# Patient Record
Sex: Female | Born: 1960 | Race: White | Hispanic: No | Marital: Married | Smoking: Never smoker
Health system: Southern US, Community
[De-identification: ages and names within clinical notes are randomized; demographics above are authoritative.]

## PROBLEM LIST (undated history)

## (undated) DIAGNOSIS — K219 Gastro-esophageal reflux disease without esophagitis: Secondary | ICD-10-CM

## (undated) DIAGNOSIS — I10 Essential (primary) hypertension: Secondary | ICD-10-CM

## (undated) DIAGNOSIS — R112 Nausea with vomiting, unspecified: Secondary | ICD-10-CM

## (undated) DIAGNOSIS — B9681 Helicobacter pylori [H. pylori] as the cause of diseases classified elsewhere: Secondary | ICD-10-CM

## (undated) DIAGNOSIS — Q825 Congenital non-neoplastic nevus: Secondary | ICD-10-CM

## (undated) DIAGNOSIS — R9409 Abnormal results of other function studies of central nervous system: Secondary | ICD-10-CM

## (undated) DIAGNOSIS — Z9889 Other specified postprocedural states: Secondary | ICD-10-CM

## (undated) DIAGNOSIS — N6310 Unspecified lump in the right breast, unspecified quadrant: Secondary | ICD-10-CM

## (undated) DIAGNOSIS — R55 Syncope and collapse: Secondary | ICD-10-CM

## (undated) DIAGNOSIS — K2970 Gastritis, unspecified, without bleeding: Secondary | ICD-10-CM

## (undated) HISTORY — DX: Gastritis, unspecified, without bleeding: K29.70

## (undated) HISTORY — PX: OTHER SURGICAL HISTORY: SHX169

## (undated) HISTORY — DX: Syncope and collapse: R55

## (undated) HISTORY — PX: ELBOW ARTHROSCOPY: SHX614

## (undated) HISTORY — PX: TUBAL LIGATION: SHX77

## (undated) HISTORY — DX: Helicobacter pylori (H. pylori) as the cause of diseases classified elsewhere: B96.81

## (undated) HISTORY — PX: CHOLECYSTECTOMY: SHX55

## (undated) HISTORY — PX: ABDOMINAL HYSTERECTOMY: SHX81

## (undated) HISTORY — DX: Essential (primary) hypertension: I10

---

## 2000-10-01 ENCOUNTER — Encounter: Payer: Self-pay | Admitting: Internal Medicine

## 2000-10-01 ENCOUNTER — Ambulatory Visit (HOSPITAL_COMMUNITY): Admission: RE | Admit: 2000-10-01 | Discharge: 2000-10-01 | Payer: Self-pay | Admitting: Internal Medicine

## 2000-10-07 ENCOUNTER — Other Ambulatory Visit: Admission: RE | Admit: 2000-10-07 | Discharge: 2000-10-07 | Payer: Self-pay | Admitting: *Deleted

## 2002-04-04 ENCOUNTER — Ambulatory Visit (HOSPITAL_COMMUNITY): Admission: RE | Admit: 2002-04-04 | Discharge: 2002-04-04 | Payer: Self-pay | Admitting: Family Medicine

## 2002-04-04 ENCOUNTER — Encounter: Payer: Self-pay | Admitting: Family Medicine

## 2002-07-04 ENCOUNTER — Inpatient Hospital Stay (HOSPITAL_COMMUNITY): Admission: RE | Admit: 2002-07-04 | Discharge: 2002-07-06 | Payer: Self-pay | Admitting: Obstetrics & Gynecology

## 2002-09-28 ENCOUNTER — Ambulatory Visit (HOSPITAL_COMMUNITY): Admission: RE | Admit: 2002-09-28 | Discharge: 2002-09-28 | Payer: Self-pay | Admitting: Obstetrics & Gynecology

## 2003-12-13 ENCOUNTER — Ambulatory Visit (HOSPITAL_COMMUNITY): Admission: RE | Admit: 2003-12-13 | Discharge: 2003-12-13 | Payer: Self-pay | Admitting: Family Medicine

## 2004-03-12 ENCOUNTER — Ambulatory Visit (HOSPITAL_COMMUNITY): Admission: RE | Admit: 2004-03-12 | Discharge: 2004-03-12 | Payer: Self-pay | Admitting: Internal Medicine

## 2004-04-27 HISTORY — PX: OTHER SURGICAL HISTORY: SHX169

## 2004-04-27 HISTORY — PX: RECTOCELE REPAIR: SHX761

## 2004-04-27 HISTORY — PX: VAGINAL HYSTERECTOMY: SHX2639

## 2007-01-03 ENCOUNTER — Ambulatory Visit (HOSPITAL_COMMUNITY): Admission: RE | Admit: 2007-01-03 | Discharge: 2007-01-03 | Payer: Self-pay | Admitting: Obstetrics & Gynecology

## 2008-05-16 ENCOUNTER — Ambulatory Visit (HOSPITAL_COMMUNITY): Admission: RE | Admit: 2008-05-16 | Discharge: 2008-05-16 | Payer: Self-pay | Admitting: Family Medicine

## 2009-01-21 ENCOUNTER — Ambulatory Visit: Payer: Self-pay | Admitting: Orthopedic Surgery

## 2009-01-21 DIAGNOSIS — M25529 Pain in unspecified elbow: Secondary | ICD-10-CM | POA: Insufficient documentation

## 2009-01-21 DIAGNOSIS — M7710 Lateral epicondylitis, unspecified elbow: Secondary | ICD-10-CM | POA: Insufficient documentation

## 2009-03-04 ENCOUNTER — Ambulatory Visit: Payer: Self-pay | Admitting: Orthopedic Surgery

## 2009-03-05 ENCOUNTER — Encounter (INDEPENDENT_AMBULATORY_CARE_PROVIDER_SITE_OTHER): Payer: Self-pay | Admitting: *Deleted

## 2009-03-08 ENCOUNTER — Ambulatory Visit (HOSPITAL_COMMUNITY): Admission: RE | Admit: 2009-03-08 | Discharge: 2009-03-08 | Payer: Self-pay | Admitting: Orthopedic Surgery

## 2009-03-14 ENCOUNTER — Telehealth: Payer: Self-pay | Admitting: Orthopedic Surgery

## 2009-03-14 ENCOUNTER — Ambulatory Visit: Payer: Self-pay | Admitting: Orthopedic Surgery

## 2009-03-15 ENCOUNTER — Ambulatory Visit (HOSPITAL_COMMUNITY): Admission: RE | Admit: 2009-03-15 | Discharge: 2009-03-15 | Payer: Self-pay | Admitting: Orthopedic Surgery

## 2009-03-15 ENCOUNTER — Ambulatory Visit: Payer: Self-pay | Admitting: Orthopedic Surgery

## 2009-03-19 ENCOUNTER — Ambulatory Visit: Payer: Self-pay | Admitting: Orthopedic Surgery

## 2009-03-25 ENCOUNTER — Encounter: Payer: Self-pay | Admitting: Orthopedic Surgery

## 2009-04-01 ENCOUNTER — Ambulatory Visit: Payer: Self-pay | Admitting: Orthopedic Surgery

## 2009-04-22 ENCOUNTER — Encounter: Payer: Self-pay | Admitting: Orthopedic Surgery

## 2009-04-25 ENCOUNTER — Ambulatory Visit: Payer: Self-pay | Admitting: Orthopedic Surgery

## 2009-05-03 ENCOUNTER — Encounter: Payer: Self-pay | Admitting: Orthopedic Surgery

## 2009-06-06 ENCOUNTER — Ambulatory Visit: Payer: Self-pay | Admitting: Orthopedic Surgery

## 2010-05-27 NOTE — Letter (Signed)
Summary: [employer] disability forms  [employer] disability forms   Imported By: Jacklynn Ganong 05/13/2009 11:48:23  _____________________________________________________________________  External Attachment:    Type:   Image     Comment:   External Document

## 2010-05-27 NOTE — Letter (Signed)
Summary: [employer] disability forms  [employer] disability forms   Imported By: Jacklynn Ganong 05/13/2009 11:47:21  _____________________________________________________________________  External Attachment:    Type:   Image     Comment:   External Document

## 2010-05-27 NOTE — Assessment & Plan Note (Signed)
Summary: 6 WK RECK RT ELBOW /POST OP 03/15/09/BCBS/BSF   Visit Type:  Follow-up Referring Provider:  Dr Cherre Huger  CC:  post op elbow.  History of Present Illness: I saw Jodi Jimenez in the office today for a followup visit.  She is a 50 years old woman with the complaint of:  DOS 03/15/09 right elbow.  Procedure  repair extensor tendon EDC right elbow with suture anchors.  Medication Ibuprofen 800.  Subjectives 6 week recheck elbow after doing exercises for ROM.  Doing well, every now and then she has a few sharp pains in cold weather.  normal exam   f/u as needed   RTW MONDAY        Allergies: 1)  ! Codeine   Impression & Recommendations:  Problem # 1:  LATERAL EPICONDYLITIS (ICD-726.32) Assessment Improved  Orders: Post-Op Check (34742)  Problem # 2:  AFTERCARE FOLLOW SURGERY MUSCULOSKEL SYSTEM NEC (ICD-V58.78) Assessment: Improved  Orders: Post-Op Check (59563)  Patient Instructions: 1)  Released  2)  RTW MONDAY

## 2010-05-27 NOTE — Letter (Signed)
Summary: Out of Work  [facility]  [address]  [place], [place] [address]   Phone: [phone]  Fax: [phone]    May 03, 2009   Employee:  Jodi Jimenez    To Whom It May Concern:   For Medical reasons, please excuse the above named employee from work for the following dates:  Start:   March 14, 2009  End:   June 06, 2009     (Next scheduled appt date: June 06, 2009)  Approximate Return to work:  June 07, 2009    If you need additional information, please feel free to contact our office.         Sincerely,    Terrance Mass, MD

## 2010-05-27 NOTE — Letter (Signed)
Summary: Out of Work  [facility]  [address]  [place], [place] [address]   Phone: [phone]  Fax: [phone]    June 06, 2009   Employee:  Jodi Jimenez    To Whom It May Concern:   For Medical reasons, and per appointment in our office today, please continue out of work status for  the above named employee from work as follows:  Start:   June 06, 2009  Patient is medically cleared to return to work, full duty, no restrictions:  June 10, 2009    If you need additional information, please feel free to contact our office.         Sincerely,    Terrance Mass, MD

## 2010-06-16 ENCOUNTER — Other Ambulatory Visit (HOSPITAL_COMMUNITY): Payer: Self-pay | Admitting: Family Medicine

## 2010-06-19 ENCOUNTER — Ambulatory Visit (HOSPITAL_COMMUNITY)
Admission: RE | Admit: 2010-06-19 | Discharge: 2010-06-19 | Disposition: A | Discharge status: Home / Self Care | Payer: BC Managed Care – PPO | Source: Ambulatory Visit | Attending: Family Medicine | Admitting: Family Medicine

## 2010-06-19 DIAGNOSIS — R109 Unspecified abdominal pain: Secondary | ICD-10-CM | POA: Insufficient documentation

## 2010-06-19 DIAGNOSIS — Z9889 Other specified postprocedural states: Secondary | ICD-10-CM | POA: Insufficient documentation

## 2010-07-30 LAB — CBC
HCT: 39.2 % (ref 36.0–46.0)
Hemoglobin: 13.4 g/dL (ref 12.0–15.0)
MCHC: 34.2 g/dL (ref 30.0–36.0)
RBC: 4.04 MIL/uL (ref 3.87–5.11)
RDW: 13 % (ref 11.5–15.5)

## 2010-07-30 LAB — BASIC METABOLIC PANEL
CO2: 28 mEq/L (ref 19–32)
Calcium: 9.2 mg/dL (ref 8.4–10.5)
GFR calc Af Amer: >60 mL/min (ref >60)
GFR calc non Af Amer: >60 mL/min (ref >60)
Glucose, Bld: 84 mg/dL (ref 70–99)
Potassium: 3.5 mEq/L (ref 3.5–5.1)
Sodium: 137 mEq/L (ref 135–145)

## 2010-08-19 ENCOUNTER — Ambulatory Visit (INDEPENDENT_AMBULATORY_CARE_PROVIDER_SITE_OTHER): Payer: BC Managed Care – PPO | Admitting: Internal Medicine

## 2010-09-09 NOTE — Op Note (Signed)
[name], Jodi Jimenez               ACCOUNT NO.:  [id number]   MEDICAL RECORD NO.:  [id number]          PATIENT TYPE:  AMB   LOCATION:  SDC                           FACILITY:  [facility]   PHYSICIAN:  Genia Del, M.D.DATE OF BIRTH:  [birth year]   DATE OF PROCEDURE:  01/03/2007  DATE OF DISCHARGE:                               OPERATIVE REPORT   PREOPERATIVE DIAGNOSIS:  Rectocele, grade 3/3.   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   PROCEDURE:  Repair of rectocele and enterocele.   SURGEON:  Genia Del, M.D.   ASSISTANT:  Lendon Colonel, M.D.   ANESTHESIOLOGIST:  Octaviano Glow. Pamalee Leyden, M.D.   PROCEDURE:  Under general anesthesia with laryngeal mask, the patient is  in lithotomy position.  She is prepped with Betadine on the suprapubic,  vulvar, and vaginal areas.  The bladder catheter is inserted, and the  patient is draped as usual.   We did a vaginal exam revealing status post hysterectomy.  The anterior  vaginal wall is normal.  The posterior vaginal wall presents a  rectocele, grade 3/3.  We put Allis' at the perineum.  We open with the  scalpel at the fourchette and resect the skin in a diamond shaped  fashion.  We then infiltrate the skin with lidocaine 1% with  epinephrine.  We used 20 mL at the posterior vaginal wall.  We then  separate the vaginal mucosa from the rectocele in the midline  posteriorly.  We then section the vaginal mucosa as we progress up to  about 3 cm from the vaginal cuff.  We then use the scalpel and the  Stroly scissors to dissect at the border of the mucosa on each side and  then separate the rectocele from the vaginal mucosa on each side.  We  notice an enterocele.  The bag is open and a pursestring suture of  Vicryl 2-0 is used to close the enterocele bag.  We then proceed to  reapproximate the fascia on each side to reduce the rectocele.  We use  Vicryl 2-0 in separate stitch to accomplish that.  We then resect  the  excess of vaginal mucosa on each side and close the vaginal mucosa with  a running locked suture of Vicryl 2-0.  We then arrive at the perineum.  We use a Vicryl 2-0 to close the perineal body and close the perineum at  the skin with a Vicryl 3-0.  Hemostasis is adequate at all  levels.  The  estimated blood loss was minimal.  No complications occurred, and the  patient was transferred to recovery room in good stable status.  Note  that a dose of Ancef 1 gram IV was given.      Genia Del, M.D.  Electronically Signed     ML/MEDQ  D:  01/03/2007  T:  01/03/2007  Job:  [id number]

## 2010-09-12 NOTE — H&P (Signed)
   NAME:  Jodi Jimenez, Jodi Jimenez                         ACCOUNT NO.:  [id number]   MEDICAL RECORD NO.:  [id number]                   PATIENT TYPE:  INP   LOCATION:  A426                                 FACILITY:  [facility]   PHYSICIAN:  Lazaro Arms, M.D.                DATE OF BIRTH:  Mar 07, 1961   DATE OF ADMISSION:  07/04/2002  DATE OF DISCHARGE:                                HISTORY & PHYSICAL   NOTE:  This is to take the place of the handwritten H&P that I did on the  day of surgery.   HISTORY OF PRESENT ILLNESS:  The patient is a 50 year old white female  gravida 3, para 3, who presented to me last month complaining of symptomatic  pelvic prolapse and genuine urinary stress incontinence.  She also has heavy  periods with clotting, dysmenorrhea and hypermenorrhea.  On examination she  has a grade 3 bladder and uterine prolapse and she has pure genuine stress  urinary incontinence, she has positive hypermobility of her bladder neck.   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   PAST SURGICAL HISTORY:  Laparoscopic cholecystectomy and laparoscopic  bilateral tubal ligation.   MEDICATIONS:  Florinef.   PAST OBSTETRICAL HISTORY:  She is G3, P3.   ALLERGIES:  None.   FAMILY HISTORY:  Noncontributory.   PHYSICAL EXAMINATION:  VITAL SIGNS:  She is 140/90.  HEENT:  Unremarkable.  NECK:  Thyroid normal.  LUNGS:  Clear.  HEART:  Regular rate/rhythm without murmurs, rubs, or gallops.  BREASTS:  Without mass, discharge, skin changes.  ABDOMEN:  Soft, nontender, nondistended; no hepatosplenomegaly; no masses.  EXTREMITIES:  No edema.  GU:  She has normal external genitalia.  She has a grade 3 cystocele and  grade 3 uterine prolapse with Valsalva.  There is mild rectocele with no  enteroceles appreciated.  The uterus normal size, shape,  contour.  The  ovaries are nontender bilaterally.  NEUROLOGICAL:  Grossly intact.   IMPRESSION:  1. Symptomatic uterovaginal prolapse, grade 3.  2. Genuine stress urinary incontinence.  3. Dysmenorrhea.  4. Hypermenorrhea.   PLAN:  The patient admitted for a TVH, anterior colporrhaphy with Pelvicol  graft, possible posterior colporrhaphy and a Urotex retropubic sling  interventional vault suspension.  The patient understands the risks,  benefits, indications, alternatives and we will proceed.                                               Lazaro Arms, M.D.    Loraine Maple  D:  07/06/2002  T:  07/06/2002  Job:  [id number]

## 2010-09-12 NOTE — Op Note (Signed)
NAME:  Jodi Jimenez, Jodi Jimenez                         ACCOUNT NO.:  [id number]   MEDICAL RECORD NO.:  [id number]                   PATIENT TYPE:  AMB   LOCATION:  DAY                                  FACILITY:  [facility]   PHYSICIAN:  Lazaro Arms, M.D.                DATE OF BIRTH:  [birth year]   DATE OF PROCEDURE:  09/28/2002  DATE OF DISCHARGE:                                 OPERATIVE REPORT   PREOPERATIVE DIAGNOSIS:  Postoperative urinary retention status post a  Pelvicol graft and sling approximately three months ago.   POSTOPERATIVE DIAGNOSIS:  Postoperative urinary retention status post a  Pelvicol graft and sling approximately three months ago.   PROCEDURE:  Urethrolysis.   SURGEON:  Lazaro Arms, M.D.   ANESTHESIA:  General endotracheal.   FINDINGS:  The patient had a TVH and left salpingectomy, an anterior  colporrhaphy with a Pelvicol graft, and Uretex mid-urethral sling  approximately three months ago.  She had problems with postoperative urinary  retention when her bladder got full.  She was doing self-catheterization  without difficulty, but her voided volumes were never greater than 100 cc,  and she could not pee in the morning or late at night.  As a result, we  decided to bring her, after trying Cardura which she could not take because  she has a mineral corticoid deficiency and Urecholine for a urethrolysis of  her sling.   DESCRIPTION OF PROCEDURE:  The patient was taken to the operating room and  placed in the supine position where she underwent general endotracheal  anesthesia.  She was then placed in the high lithotomy position.  The lower  abdomen, perineum, vagina, and perianal area were prepped and draped in the  usual sterile fashion.  A Foley catheter was placed.  The bladder neck was  identified, and the end of the urethra was identified.  It was injected and  incised and dissected open.  I found the graft without difficulty.  It was  in the proper  position.  It had such good tissue ingrowth that I really  could not loosen it up.  As a result, I cut it at 8 and 4 and sent that part  of the sling off to pathology for confirmation.  Her Pelvicol graft was in  place and came up to the level of the bladder neck, and I think that is  probably going to be sufficient for her.  Interrupted sutures were then  placed for closure of the surgical defect.  There was good hemostasis.  No  packing was used.  The Foley was removed.  The patient was awakened from  anesthesia and taken to the recovery room in good and stable condition.  All  counts were correct.  Lazaro Arms, M.D.    Loraine Maple  D:  09/28/2002  T:  09/28/2002  Job:  [id number]

## 2010-09-12 NOTE — H&P (Signed)
NAME:  Jodi Jimenez, Jodi Jimenez                         ACCOUNT NO.:  [id number]   MEDICAL RECORD NO.:  [id number]                   PATIENT TYPE:  AMB   LOCATION:  DAY                                  FACILITY:  [facility]   PHYSICIAN:  Lazaro Arms, M.D.                DATE OF BIRTH:  02/25/1961   DATE OF ADMISSION:  09/28/2002  DATE OF DISCHARGE:                                HISTORY & PHYSICAL   HISTORY OF PRESENT ILLNESS:  The patient is a 50 year old white female,  gravida 3, para 3, status post TVH, left salpingectomy, anterior  colporrhaphy, with a __________ and a placement of a Urotex mid urethral  sling.  The patient had a significant bladder prolapse and concern was that  postoperatively that an anterior colporrhaphy with the graft, that we would  leave her urethra hypermobile and she would experience stress incontinence.  She had had some episodes of that in the past but not very frequently and we  talked about the pluses and minuses of doing that.  Postoperatively she had  significant problem with urinary retention.  She does void.  She cannot void  when her bladder is full and she is unable to void to effectively empty her  bladder.  I put her on Cardura but she is unable to tolerate it because of  her use of Florinef because she has a __________ disorder and requires  actually the steroids to maintain her blood pressure.  We tried her on that  and of course she could not tolerate it.  She took one dose.  We have given  now close to three months to heal, also gave her Urecholine to see if  improving her bladder tone would help and that has not helped either.  As a  result she understands that probably the graft healed in too tightly and  with the bladder support, is creating an obstructive picture.  As a result  she is admitted for a sling revision, probably which will entail cutting the  Urotex sling.   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   PAST SURGICAL HISTORY:  1. Laparoscopic tubal.  2. Laparoscopic cholecystectomy.  3. Above mentioned procedure.   PAST OB HISTORY:  Three vaginal deliveries.   ALLERGIES:  CODEINE.   MEDICATIONS:  Florinef.   REVIEW OF SYSTEMS:  Otherwise negative currently.   PHYSICAL EXAMINATION:  VITAL SIGNS:  Her weight is 167 pounds, blood  pressure 120/80.  HEENT:  Unremarkable.  NECK:  thyroid is normal.  LUNGS:  Clear.  HEART:  Regular rate and rhythm.  No regurg or gallop.  BREASTS:  Without masses or discharge, skin changes.  ABDOMEN:  Benign.  No hepatosplenomegaly or masses.  GENITALIA:  She has normal external genitalia.  Vagina is pink and moist  without discharge.  Cuff is well-healed.  Grafts intact.  RECTAL:  Exam  is normal.  EXTREMITIES:  Warm without edema.  NEUROLOGIC:  Grossly intact.   IMPRESSION:  1. Postoperative urinary retention after a Urotex  mid urethral sling     placement.  2. Unable to take Cardura because of adrenal corticoid hypofunction.   PLAN:  The patient is admitted for a urethrolysis, which is cutting the  Urotex sling.  She understands the indications for the surgery, the fact  that we tried other alternatives that did not work, and she understands the  risk of stress incontinence postoperatively.                                               Lazaro Arms, M.D.    Loraine Maple  D:  09/27/2002  T:  09/27/2002  Job:  [id number]

## 2010-09-12 NOTE — Discharge Summary (Signed)
   NAME:  Jodi Jimenez, Jodi Jimenez                         ACCOUNT NO.:  [id number]   MEDICAL RECORD NO.:  [id number]                   PATIENT TYPE:  INP   LOCATION:  A426                                 FACILITY:  [facility]   PHYSICIAN:  Lazaro Arms, M.D.                DATE OF BIRTH:  13-Feb-1961   DATE OF ADMISSION:  07/04/2002  DATE OF DISCHARGE:  07/06/2002                                 DISCHARGE SUMMARY   DISCHARGE DIAGNOSES:  1. Status post total vaginal hysterectomy.  2. Left salpingectomy.  3. Anterior colporrhaphy with pelvic hull graft.  4. A Urotext retropubic sling placement and a vaginal vault suspension.     Normal postoperative course.   HISTORY OF PRESENT ILLNESS:  Please refer to the transcribed history and  physical and operative note details mentioned in the hospital.   HOSPITAL COURSE:  The patient did well postoperatively.  Packing was removed  on postoperative day #1.  Her hemoglobin and hematocrit were stable.  She  came in slightly anemic and had an appropriate expected drop from 12.2 to  10.2.  She remained afebrile, tolerated clear liquids and a regular diet.  Her Foley catheter was removed, but she could not void so it was replaced  and she was sent home on postoperative day #2 to come back to the office the  following week and have it removed, undergo voiding trials, and probably  self-catheterization.   DISCHARGE MEDICATIONS:  She is discharged on Tylox and Levaquin.   DISCHARGE INSTRUCTIONS:  She is given instruction/precautions for return.                                               Lazaro Arms, M.D.    Loraine Maple  D:  07/12/2002  T:  07/12/2002  Job:  [id number]

## 2010-09-12 NOTE — Op Note (Signed)
NAME:  Jodi Jimenez, BLACKSTON                         ACCOUNT NO.:  [id number]   MEDICAL RECORD NO.:  [id number]                   PATIENT TYPE:  INP   LOCATION:  A426                                 FACILITY:  [facility]   PHYSICIAN:  Lazaro Arms, M.D.                DATE OF BIRTH:  [birth year]   DATE OF PROCEDURE:  07/04/2002  DATE OF DISCHARGE:                                 OPERATIVE REPORT   PREOPERATIVE DIAGNOSES:  1. Symptomatic uterovaginal prolapse, grade 3.  2. Stress urinary incontinence.  3. Dysmenorrhea.  4. Hypermenorrhea.   POSTOPERATIVE DIAGNOSES:  1. Symptomatic uterovaginal prolapse, grade 3.  2. Stress urinary incontinence.  3. Dysmenorrhea.  4. Hypermenorrhea.  5. Left hydrosalpinx, status post tubal.   PROCEDURE:  1. Vaginal hysterectomy.  2. Uretex retropubic sling.  3. Vaginal vault suspension, interspinous.  4. Anterior colporrhaphy using a Pelvicol graft.  5. Left salpingectomy due to the hydrosalpinx on the left.   SURGEON:  Lazaro Arms, M.D.   ANESTHESIA:  General endotracheal.   FINDINGS:  The patient was known to have grade 3 bladder and uterine  prolapse.  Really no rectal problem, no enterocele appreciated.  Preoperatively, her uterus had a small fibroid on the posterior wall.  The  ovaries were both normal bilaterally, but her left fallopian tube was a  hydrosalpinx status post tubal.   DESCRIPTION OF PROCEDURE:  The patient was taken to the operating room and  placed in the supine position where she underwent general endotracheal  anesthesia.  She was then placed in the dorsal supine position in candy-cane  stirrups.  The lower abdomen, perineum, inner thigh, vagina, and perirectal  area were prepped and then she was subsequently draped.  A Foley catheter  was placed.   A weighted speculum was placed.  The cervix was grasped.  Marcaine 0.5% with  1:200,000 epinephrine was injected about the cervix in a circumferential  fashion to develop a  plane and for hemostasis.  The electrocautery unit was  used, and the vagina was incised.  The anterior and posterior vagina were  pushed off the lower uterine segment.  The posterior cul-de-sac was entered  sharply without difficulty.  The uterosacral ligaments were clamped, cut,  transfixed, suture ligature, and held.  The cardinal ligaments were clamped,  cut, suture ligated, and cut.  The anterior cul-de-sac was entered without  difficulty.  The anterior and posterior leafs of the broad ligament were  plicated.  The uterine vessels were clamped, cut, and suture ligated.  Serial pedicles were taken off the fundus, each pedicle being clamped, cut,  and suture ligated.  The utero-ovarian ligaments were crossclamped  bilaterally, and the specimen was removed.  Double suture ligation was  performed.  Hemoclips were used for additional hemostasis and to prevent the  perineum from sliding up.  She had a left hydrosalpinx that was removed  separately without difficulty.  There was good hemostasis at all pedicles.  The perineum was closed in a pursestring fashion.  The vagina was closed  laterally with interrupted sutures.  The anterior vagina, posterior vagina  were closed in interrupted fashion.   I did not close the midline at this point because of the anterior  colporrhaphy.  I used 0.% Marcaine again in the midline of the vaginal  mucosa underneath the bladder and dissected the bladder off of the vagina  and incised sharply.  The vesicocervical fascia was then peeled back until I  got lateral on both sides and could palpate the ischial spine and the pubis  anteriorly.  The Pelvicol graft was then cut to previous specifications, and  bilateral ischial spine sutures were placed using the Cappio self-retrieving  needle.  This was then done bilaterally, and the graft was sewn in place  without difficulty.  The anterior portion of the graft was then anchored to  the pubocervical fascia  bilaterally, and there was excellent support  underneath the bladder and the sutures of the ischial spine were well-  placed.   I then performed a Uretex sling.  The Foley catheter was removed.  Small  incisions were made just about 1 cm to 2 cm from the midline on the pubis.  The Uretex needle driver was then used and punctured the rectus fascia.  It  was then curved 90 degrees into the abdomen and walked down off the pubis to  perforate the endopelvic fascia bilaterally.  With the needle in, I  performed cystoscopy on both sides and could plainly see with about 400-500  cc of fluid in the bladder that there was no perforation of the bladder.  When I wiggled the needle, I could see it pushing from the outside, so I  knew that there was no perforation.  The Uretex sling was then brought back  through, and the sleeve of the Uretex was removed.  I insured that it was  not too tight by taking a Kelly clamp and moving it 90 degrees and moving it  from side to side to insure that it was not too tight.  The skin was then  pressed down, and the Uretex was clipped.  At this point, two staples were  removed at both skin sites.  I then did an interspinous vaginal vault  suspension by suturing the vagina to the Uretex graft without difficulty.  The excess about the vagina was removed sharply, and it was closed in an  interrupted fashion all the way down to the vagina.  The vagina was then  closed at the apex with a box stitch.   At this point, I did not feel that a posterior repair was a appropriate at  this time.  I talked to the patient and told her that I wanted to make sure  and preserve good sexual function at this point, and if we needed to come  back and a posterior repair in the future, it could be an outpatient  procedure without any difficulty.  She agreed with that.  She did not have  any constipation symptoms.  The patient tolerated the procedure well.  She experienced 300 cc of  blood  loss.  She was awakened from anesthesia and taken to the recovery room in  good and stable condition.  All counts were correct x3.  All specimens were  sent to pathology for evaluation.  Lazaro Arms, M.D.    Loraine Maple  D:  07/06/2002  T:  07/06/2002  Job:  [id number]

## 2011-01-02 ENCOUNTER — Encounter: Payer: Self-pay | Admitting: Cardiovascular Disease

## 2011-01-23 ENCOUNTER — Encounter: Payer: Self-pay | Admitting: Cardiovascular Disease

## 2011-01-23 ENCOUNTER — Ambulatory Visit (INDEPENDENT_AMBULATORY_CARE_PROVIDER_SITE_OTHER): Payer: BC Managed Care – PPO | Admitting: Cardiovascular Disease

## 2011-01-23 DIAGNOSIS — I10 Essential (primary) hypertension: Secondary | ICD-10-CM

## 2011-01-23 DIAGNOSIS — R55 Syncope and collapse: Secondary | ICD-10-CM

## 2011-01-23 NOTE — Progress Notes (Addendum)
50 yo previously seen by Dr Graciela Husbands for vasovagal syncope. Was on Florinef for a while but BP was elevated and stopped.  Was on BP meds last few years.  Had 3 episodes of passing out last month.  First one at [facility] after swimming in pool, shower and with ETOH.  Feels hot and then looks white.  Gets a spinning sensation in head.  No SSCP, palpitations or dyspnea.  No known structural heart disease.  Had tilt table in the late 90's and records not available in echart.  She indicates "flat lining".  Has not hurt herself.  Other episodes at work and not in association with any other issues.  Diuretic recently stopped no other change in meds.  Not been postural  ROS: Denies fever, malais, weight loss, blurry vision, decreased visual acuity, cough, sputum, SOB, hemoptysis, pleuritic pain, palpitaitons, heartburn, abdominal pain, melena, lower extremity edema, claudication, or rash.  All other systems reviewed and negative  Reviewed records from Dr Regino Schultze and echart  General: Affect appropriate Healthy:  appears stated age HEENT: normal Neck supple with no adenopathy JVP normal no bruits no thyromegaly Lungs clear with no wheezing and good diaphragmatic motion Heart:  S1/S2 no murmur,rub, gallop or click PMI normal Abdomen: benighn, BS positve, no tenderness, no AAA no bruit.  No HSM or HJR Distal pulses intact with no bruits No edema Neuro non-focal Skin warm and dry No muscular weakness  Medications Current Outpatient Prescriptions  Medication Sig Dispense Refill  . Doxylamine Succinate, Sleep, (SLEEP AID PO) Take by mouth at bedtime.        Marland Kitchen lisinopril (PRINIVIL,ZESTRIL) 20 MG tablet Take 20 mg by mouth daily.        . Multiple Vitamin (MULTIVITAMIN) tablet Take 1 tablet by mouth daily.          Allergies Codeine  Family History: Family History  Problem Relation Age of Onset  . Heart failure      x3 maternal side  . Heart attack    . Diabetes    . Heart disease     paternal side    Social History: History   Social History  . Marital Status: Married    Spouse Name: N/A    Number of Children: 3  . 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 Smoking status: Never Smoker   . Smokeless tobacco: Not on file  . Alcohol Use: Yes     occasionally  . Drug Use: No  . Sexually Active: Not on file   Other Topics Concern  . Not on file   Social History Narrative  . No narrative on file    Electrocardiogram:  NSR 69 Normal ECG  Assessment and Plan

## 2011-01-23 NOTE — Assessment & Plan Note (Signed)
Sounds like vasovagal syncope.  Doubt significant structural heart disease.  Does not need another tilt table.  Will do stress echo to make sure hemodynamic response to exercise is normal and EF normal.  F.U with Dr Graciela Husbands to discuss further and consider loop recorder if episodes persist

## 2011-01-23 NOTE — Patient Instructions (Signed)
Your physician has requested that you have a stress echocardiogram. For further information please visit [email or web address] Please follow instruction sheet as given.  Your physician recommends that you continue on your current medications as directed. Please refer to the Current Medication list given to you today.  Your physician recommends that you schedule a follow-up appointment in: after test with Dr. Graciela Husbands

## 2011-01-23 NOTE — Assessment & Plan Note (Signed)
Continue lisinopril  Agree with no diuretic.  Not postural in office today

## 2011-01-27 ENCOUNTER — Ambulatory Visit (HOSPITAL_COMMUNITY)
Admission: RE | Admit: 2011-01-27 | Discharge: 2011-01-27 | Disposition: A | Discharge status: Home / Self Care | Payer: BC Managed Care – PPO | Source: Ambulatory Visit | Attending: Cardiovascular Disease | Admitting: Cardiovascular Disease

## 2011-01-27 DIAGNOSIS — R55 Syncope and collapse: Secondary | ICD-10-CM

## 2011-01-27 DIAGNOSIS — I10 Essential (primary) hypertension: Secondary | ICD-10-CM | POA: Insufficient documentation

## 2011-01-27 NOTE — Progress Notes (Signed)
*  PRELIMINARY RESULTS* Echocardiogram Echocardiogram Stress Test has been performed.  Jodi Jimenez 01/27/2011, 10:01 AM

## 2011-01-27 NOTE — Progress Notes (Signed)
Stress Lab Nurses Notes - [facility]  JACKQUELYN SUNDBERG 01/27/2011  Reason for doing test: Syncope  Type of test: Stress Echo  Nurse performing test: Parke Poisson, RN  Nuclear Medicine Tech: Not Applicable  Echo Tech: Karrie Doffing  MD performing test: P. Nishan & Joni Reining NP  Family MD: Sherwood Gambler  Test explained and consent signed: yes  IV started: No IV started  Symptoms: Flushed and dizzy  Treatment/Intervention: None  Reason test stopped: dizziness and reached target HR  After recovery IV was: NA  Patient to return to Nuc. Med at :NA  Patient discharged: Home  Patient's Condition upon discharge was: stable  Comments:  Peak BP 192/62 & HR 160. During recovery BP 138/70 & HR 80. Symptoms resolved in recovery.  Erskine Speed T

## 2011-01-28 ENCOUNTER — Encounter: Payer: Self-pay | Admitting: Internal Medicine

## 2011-01-29 ENCOUNTER — Ambulatory Visit (INDEPENDENT_AMBULATORY_CARE_PROVIDER_SITE_OTHER): Payer: BC Managed Care – PPO | Admitting: Internal Medicine

## 2011-01-29 ENCOUNTER — Encounter: Payer: Self-pay | Admitting: Internal Medicine

## 2011-01-29 DIAGNOSIS — R55 Syncope and collapse: Secondary | ICD-10-CM

## 2011-01-29 DIAGNOSIS — I10 Essential (primary) hypertension: Secondary | ICD-10-CM

## 2011-01-29 NOTE — Assessment & Plan Note (Signed)
Continue to use non-diuretic therapy

## 2011-01-29 NOTE — Assessment & Plan Note (Signed)
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 The symptoms have not changed appreciably. There is no clear trigger. The stereotypical prodrome offers [place] our best chance of avoiding syncope. We have reviewed the physiology and the importance of becoming recumbent.  In the event that she would like to pursue other options, I would recommend implantation of a loop recorder as the data from the issue 3 trial suggests that the coworkers patients with significant bradycardia can't be benefited by pacing

## 2011-01-29 NOTE — Patient Instructions (Signed)
Your physician wants you to follow-up in: 4 months You will receive a reminder letter in the mail two months in advance. If you don't receive a letter, please call our office to schedule the follow-up appointment.     .Your physician recommends that you continue on your current medications as directed. Please refer to the Current Medication list given to you today.  

## 2011-01-29 NOTE — Progress Notes (Signed)
HPI: Jodi Jimenez is a 50 y.o. female Seen at her request and that of Dr. Jamse Mead because of recurrent syncope.  I apparently met her 15 years ago for syncope with a stereo typical prodrome. She was admitted for tilt table testing. This is abnormal and she got started on Florinef. We have not seen her since. She had a significant decrease in her episodes for about 10 years. She was seen by the [facility] medical group with recurrent episodes about 5 or 6 years ago and was noted to be hypertensive. The Florinef was discontinued and ACE inhibitor/diuretic therapy was initiated.  She had an episode in [place] with her same prodrome that occurred following nausea developing at a meal standing up going to the bathroom and becoming syncopal and she returned. The diuretic was discontinued at that time.  These episodes occur every month or 2. SHe has frequent episodes of lightheadedness apart from the above. Some of this is positional and includes when she lies down he seems to be a separate syndrome.  She underwent stress echo earlier this week which was normal Current Outpatient Prescriptions  Medication Sig Dispense Refill  . Doxylamine Succinate, Sleep, (SLEEP AID PO) Take by mouth at bedtime.        Marland Kitchen lisinopril (PRINIVIL,ZESTRIL) 20 MG tablet Take 20 mg by mouth daily.        . Multiple Vitamin (MULTIVITAMIN) tablet Take 1 tablet by mouth daily.          Allergies  Allergen Reactions  . Codeine     Past Medical History  Diagnosis Date  . HTN (hypertension)   . Syncope and collapse     Past Surgical History  Procedure Date  . Cholecystectomy   . Vaginal hysterectomy   . Incontinence surgery   . Rectocele repair     Family History  Problem Relation Age of Onset  . Heart failure      x3 maternal side  . Heart attack    . Diabetes    . Heart disease      paternal side    History   Social History  . Marital Status: Married    Spouse Name: N/A    Number of Children: 3  .  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 Smoking status: Never Smoker   . Smokeless tobacco: Not on file  . Alcohol Use: Yes     occasionally  . Drug Use: No  . Sexually Active: Not on file   Other Topics Concern  . Not on file   Social History Narrative  . No narrative on file    Fourteen point review of systems was negative except as noted in HPI and PMH   PHYSICAL EXAMINATION  Blood pressure 137/85, pulse 72, height 5' 3.5" (1.613 m), weight 170 lb 6.4 oz (77.293 kg).   Well developed and nourished middle-aged Caucasian female in no acute distress HENT normal Neck supple with JVP-flat Carotids brisk and full without bruits Back without scoliosis or kyphosis Clear Regular rate and rhythm, no murmurs or gallops Abd-soft with active BS without hepatomegaly or midline pulsation Femoral pulses 2+ distal pulses intact No Clubbing cyanosis edema Skin-warm and dry LN-neg submandibular and supraclavicular A & Oriented CN 3-12 normal  Grossly normal sensory and motor function Affect engaging . Electrocardiogram from 20 September was normal

## 2011-02-06 LAB — CBC
Hemoglobin: 13.8
MCHC: 34.3
MCV: 93.3
RBC: 4.32
RDW: 13.1

## 2011-02-12 ENCOUNTER — Telehealth: Payer: Self-pay | Admitting: Internal Medicine

## 2011-02-12 NOTE — Telephone Encounter (Signed)
LOV x2 faxed to [facility] @ [phone]  02/12/11/km

## 2011-05-13 ENCOUNTER — Encounter (HOSPITAL_COMMUNITY): Payer: Self-pay | Admitting: Cardiovascular Disease

## 2012-10-26 ENCOUNTER — Telehealth: Payer: Self-pay

## 2012-10-26 NOTE — Telephone Encounter (Signed)
LMOM to call.

## 2012-11-01 NOTE — Telephone Encounter (Signed)
Pt is at optometrist office now, will call later today.

## 2012-11-09 ENCOUNTER — Encounter (INDEPENDENT_AMBULATORY_CARE_PROVIDER_SITE_OTHER): Payer: Self-pay

## 2012-11-09 NOTE — Telephone Encounter (Signed)
Pt is scheduled OV with Tana Coast, PA on 12/08/2012 at 1:30 PM for diarrhea prior to scheduling TCS.

## 2012-11-09 NOTE — Telephone Encounter (Signed)
LMOM to call.

## 2012-12-08 ENCOUNTER — Encounter: Payer: Self-pay | Admitting: Gastroenterology

## 2012-12-08 ENCOUNTER — Encounter (HOSPITAL_COMMUNITY): Payer: Self-pay | Admitting: Pharmacy Technician

## 2012-12-08 ENCOUNTER — Ambulatory Visit (INDEPENDENT_AMBULATORY_CARE_PROVIDER_SITE_OTHER): Payer: BC Managed Care – PPO | Admitting: Gastroenterology

## 2012-12-08 VITALS — BP 112/73 | HR 89 | Temp 98.4°F | Ht 63.0 in | Wt 187.4 lb

## 2012-12-08 DIAGNOSIS — K625 Hemorrhage of anus and rectum: Secondary | ICD-10-CM | POA: Insufficient documentation

## 2012-12-08 DIAGNOSIS — R197 Diarrhea, unspecified: Secondary | ICD-10-CM

## 2012-12-08 DIAGNOSIS — R198 Other specified symptoms and signs involving the digestive system and abdomen: Secondary | ICD-10-CM | POA: Insufficient documentation

## 2012-12-08 MED ORDER — PEG-KCL-NACL-NASULF-NA ASC-C 100 G PO SOLR: 1.0000 | ORAL | Status: DC

## 2012-12-08 NOTE — Progress Notes (Signed)
CC'd to PCP 

## 2012-12-08 NOTE — Patient Instructions (Addendum)
1. Please have your blood work done as soon as possible. 2. We have scheduled you for colonoscopy with Dr. Darrick Penna. Please see separate instructions.

## 2012-12-08 NOTE — Assessment & Plan Note (Signed)
52 year old lady who presents for her first ever colonoscopy. She has a several month history of diarrhea, change from typical bowel pattern. Intermittent bright red blood per rectum. 3-10 stools daily, worse postprandially. Mild nausea, postprandial cramping. Doubt infectious etiology. Consider celiac disease, microscopic colitis, IBD. Recommend celiac serologies and colonoscopy in the near future.  I have discussed the risks, alternatives, benefits with regards to but not limited to the risk of reaction to medication, bleeding, infection, perforation and the patient is agreeable to proceed. Written consent to be obtained.

## 2012-12-08 NOTE — Progress Notes (Signed)
Primary Care Physician:  Catalina Pizza, MD  Primary Gastroenterologist:  Jonette Eva, MD   Chief Complaint  Patient presents with  . Colonoscopy  . Diarrhea    HPI:  Jodi Jimenez is a 52 y.o. female here for further evaluation of diarrhea.  For the last few months, PP loose stools with urgency. BM 3-10 times per day. Some days worse than other. Not related to specific foods. Some brbpr. No melena. Nausea but no vomiting. Some abdominal cramps. No heartburn. No dysphagia. No weight loss. No prior colonoscopy. No recent antibiotics or travel abroad. Before, just had BM twice per day. Tried probiotics without relief. Some imodium prn.   Current Outpatient Prescriptions  Medication Sig Dispense Refill  . ibuprofen (ADVIL,MOTRIN) 200 MG tablet Take 200 mg by mouth every 6 (six) hours as needed for pain.      Marland Kitchen lisinopril (PRINIVIL,ZESTRIL) 30 MG tablet Take 30 mg by mouth daily.       . Doxylamine Succinate, Sleep, (SLEEP AID PO) Take by mouth at bedtime.        . Multiple Vitamin (MULTIVITAMIN) tablet Take 1 tablet by mouth daily.         No current facility-administered medications for this visit.    Allergies as of 12/08/2012 - Review Complete 12/08/2012  Allergen Reaction Noted  . Codeine Nausea And Vomiting     Past Medical History  Diagnosis Date  . HTN (hypertension)   . Syncope and collapse     Dr. Clide Cliff, tilt table test+    Past Surgical History  Procedure Laterality Date  . Cholecystectomy    . Vaginal hysterectomy  2006    with bladder sling  . Take down of bladder sling  2006  . Rectocele repair  2006    Family History  Problem Relation Age of Onset  . Heart failure      x3 maternal side  . Heart attack    . Diabetes    . Heart disease      paternal side  . Colon cancer Neg Hx     History   Social History  . Marital Status: Married    Spouse Name: N/A    Number of Children: 3  . Years of Education: N/A   Occupational History  . [employer] - Restaurant manager, fast food    Social History Main Topics  . Smoking status: Never Smoker   . Smokeless tobacco: Not on file  . Alcohol Use: Yes     Comment: occasionally, very rare  . Drug Use: No  . Sexual Activity: Not on file   Other Topics Concern  . Not on file   Social History Narrative  . No narrative on file      ROS:  General: Negative for anorexia, weight loss, fever, chills, fatigue, weakness. Eyes: Negative for vision changes.  ENT: Negative for hoarseness, difficulty swallowing , nasal congestion. CV: Negative for chest pain, angina, palpitations, dyspnea on exertion, peripheral edema.  Respiratory: Negative for dyspnea at rest, dyspnea on exertion, cough, sputum, wheezing.  GI: See history of present illness. GU:  Negative for dysuria, hematuria, urinary incontinence, urinary frequency, nocturnal urination.  MS: Negative for joint pain, low back pain.  Derm: Negative for rash or itching.  Neuro: Negative for weakness, abnormal sensation, seizure, frequent headaches, memory loss, confusion.  Psych: Negative for anxiety, depression, suicidal ideation, hallucinations.  Endo: Negative for unusual weight change.  Heme: Negative for bruising or bleeding. Allergy: Negative for rash or hives.  Physical Examination:  BP 112/73  Pulse 89  Temp(Src) 98.4 F (36.9 C) (Oral)  Ht 5\' 3"  (1.6 m)  Wt 187 lb 6.4 oz (85.004 kg)  BMI 33.2 kg/m2   General: Well-nourished, well-developed in no acute distress.  Head: Normocephalic, atraumatic.   Eyes: Conjunctiva pink, no icterus. Mouth: Oropharyngeal mucosa moist and pink , no lesions erythema or exudate. Neck: Supple without thyromegaly, masses, or lymphadenopathy.  Lungs: Clear to auscultation bilaterally.  Heart: Regular rate and rhythm, no murmurs rubs or gallops.  Abdomen: Bowel sounds are normal, nontender, nondistended, no hepatosplenomegaly or masses, no abdominal bruits or    hernia , no rebound or guarding.   Rectal: not  performed Extremities: No lower extremity edema. No clubbing or deformities.  Neuro: Alert and oriented x 4 , grossly normal neurologically.  Skin: Warm and dry, no rash or jaundice.   Psych: Alert and cooperative, normal mood and affect.

## 2012-12-13 ENCOUNTER — Encounter (HOSPITAL_COMMUNITY): Payer: Self-pay | Admitting: *Deleted

## 2012-12-13 ENCOUNTER — Encounter (HOSPITAL_COMMUNITY)
Admission: RE | Disposition: A | Discharge status: Home / Self Care | Payer: Self-pay | Source: Ambulatory Visit | Attending: Gastroenterology

## 2012-12-13 ENCOUNTER — Ambulatory Visit (HOSPITAL_COMMUNITY)
Admission: RE | Admit: 2012-12-13 | Discharge: 2012-12-13 | Disposition: A | Discharge status: Home / Self Care | Payer: BC Managed Care – PPO | Source: Ambulatory Visit | Attending: Gastroenterology | Admitting: Gastroenterology

## 2012-12-13 DIAGNOSIS — Z885 Allergy status to narcotic agent status: Secondary | ICD-10-CM | POA: Insufficient documentation

## 2012-12-13 DIAGNOSIS — K648 Other hemorrhoids: Secondary | ICD-10-CM | POA: Insufficient documentation

## 2012-12-13 DIAGNOSIS — R197 Diarrhea, unspecified: Secondary | ICD-10-CM | POA: Insufficient documentation

## 2012-12-13 DIAGNOSIS — K2960 Other gastritis without bleeding: Secondary | ICD-10-CM | POA: Insufficient documentation

## 2012-12-13 DIAGNOSIS — K625 Hemorrhage of anus and rectum: Secondary | ICD-10-CM | POA: Insufficient documentation

## 2012-12-13 DIAGNOSIS — Z9089 Acquired absence of other organs: Secondary | ICD-10-CM | POA: Insufficient documentation

## 2012-12-13 DIAGNOSIS — K2970 Gastritis, unspecified, without bleeding: Secondary | ICD-10-CM

## 2012-12-13 DIAGNOSIS — K922 Gastrointestinal hemorrhage, unspecified: Secondary | ICD-10-CM

## 2012-12-13 DIAGNOSIS — K2990 Gastroduodenitis, unspecified, without bleeding: Secondary | ICD-10-CM

## 2012-12-13 DIAGNOSIS — K620 Anal polyp: Secondary | ICD-10-CM | POA: Insufficient documentation

## 2012-12-13 DIAGNOSIS — Z79899 Other long term (current) drug therapy: Secondary | ICD-10-CM | POA: Insufficient documentation

## 2012-12-13 DIAGNOSIS — K621 Rectal polyp: Secondary | ICD-10-CM

## 2012-12-13 DIAGNOSIS — D126 Benign neoplasm of colon, unspecified: Secondary | ICD-10-CM | POA: Insufficient documentation

## 2012-12-13 DIAGNOSIS — R198 Other specified symptoms and signs involving the digestive system and abdomen: Secondary | ICD-10-CM

## 2012-12-13 DIAGNOSIS — I10 Essential (primary) hypertension: Secondary | ICD-10-CM | POA: Insufficient documentation

## 2012-12-13 HISTORY — PX: COLONOSCOPY: SHX5424

## 2012-12-13 HISTORY — PX: ESOPHAGOGASTRODUODENOSCOPY: SHX5428

## 2012-12-13 SURGERY — COLONOSCOPY
Anesthesia: Moderate Sedation

## 2012-12-13 MED ORDER — SODIUM CHLORIDE 0.9 % IV SOLN
INTRAVENOUS | Status: DC
  Administered 2012-12-13: 11:00:00 via INTRAVENOUS

## 2012-12-13 MED ORDER — MIDAZOLAM HCL 5 MG/5ML IJ SOLN
INTRAMUSCULAR | Status: AC
  Filled 2012-12-13: qty 10

## 2012-12-13 MED ORDER — MIDAZOLAM HCL 5 MG/5ML IJ SOLN
INTRAMUSCULAR | Status: AC
  Filled 2012-12-13: qty 5

## 2012-12-13 MED ORDER — ONDANSETRON 4 MG PO TBDP
ORAL_TABLET | ORAL | Status: AC
  Filled 2012-12-13: qty 1

## 2012-12-13 MED ORDER — MIDAZOLAM HCL 5 MG/5ML IJ SOLN
INTRAMUSCULAR | Status: DC | PRN
  Administered 2012-12-13: 2 mg via INTRAVENOUS
  Administered 2012-12-13 (×2): 1 mg via INTRAVENOUS
  Administered 2012-12-13 (×2): 2 mg via INTRAVENOUS
  Administered 2012-12-13: 1 mg via INTRAVENOUS

## 2012-12-13 MED ORDER — MEPERIDINE HCL 100 MG/ML IJ SOLN
INTRAMUSCULAR | Status: AC
  Filled 2012-12-13: qty 2

## 2012-12-13 MED ORDER — BUTAMBEN-TETRACAINE-BENZOCAINE 2-2-14 % EX AERO
INHALATION_SPRAY | CUTANEOUS | Status: DC | PRN
  Administered 2012-12-13: 2 via TOPICAL

## 2012-12-13 MED ORDER — ONDANSETRON 4 MG PO TBDP
4.0000 mg | ORAL_TABLET | Freq: Once | ORAL | Status: AC
  Administered 2012-12-13: 4 mg via ORAL

## 2012-12-13 MED ORDER — STERILE WATER FOR IRRIGATION IR SOLN
Status: DC | PRN
  Administered 2012-12-13: 12:00:00

## 2012-12-13 MED ORDER — MEPERIDINE HCL 100 MG/ML IJ SOLN
INTRAMUSCULAR | Status: DC | PRN
  Administered 2012-12-13: 50 mg via INTRAVENOUS
  Administered 2012-12-13 (×3): 25 mg via INTRAVENOUS

## 2012-12-13 NOTE — Op Note (Addendum)
[facility] [address] [place], [address]   ENDOSCOPY PROCEDURE REPORT  PATIENT: Jodi Jimenez, Jodi Jimenez  MR#: [id number] BIRTHDATE: February 24, 1961 , 51  yrs. old GENDER: Female  ENDOSCOPIST: Jonette Eva, MD REFERRED NF:AOZH Hall, M.D.  PROCEDURE DATE: 12/13/2012 PROCEDURE:   EGD w/ biopsy  INDICATIONS:Unexplained bloody diarrhea. PSHx: CHOLECYSTECTOMY MEDICATIONS: TCS+ Versed 1mg  IV TOPICAL ANESTHETIC:   Cetacaine Spray  DESCRIPTION OF PROCEDURE:     Physical exam was performed.  Informed consent was obtained from the patient after explaining the benefits, risks, and alternatives to the procedure.  The patient was connected to the monitor and placed in the left lateral position.  Continuous oxygen was provided by nasal cannula and IV medicine administered through an indwelling cannula.  After administration of sedation, the patients esophagus was intubated and the EG-[id number] ([id number])  endoscope was advanced under direct visualization to the second portion of the duodenum.  The scope was removed slowly by carefully examining the color, texture, anatomy, and integrity of the mucosa on the way out.  The patient was recovered in endoscopy and discharged home in satisfactory condition.   ESOPHAGUS: The mucosa of the esophagus appeared normal.   STOMACH: Nodular gastritis (inflammation) was found in the gastric antrum. Multiple biopsies were performed using cold forceps.   DUODENUM: The duodenal mucosa showed no abnormalities in the bulb and second portion of the duodenum.  COMPLICATIONS: NAUSEAOUS AFTERWARDS. BETTER WITH ZOFRAN.  ENDOSCOPIC IMPRESSION: 1.   MODERATE Nodular gastritis  RECOMMENDATIONS: FOLLOW A HIGH FIBER//LOW FAT DIET.  AVOID ITEMS THAT CAUSE BLOATING.  CHEW ONE TUMS WITH MEALS THREE TIMES A DAY. USE IMODIUM AS NEEDED 30 MINS PRIOR TO MEALS UP TO THREE TIMES A DAY. BIOPSY RESULTS SHOULD BE BACK IN 7 DAYS. FOLLOW UP IN 2 MOS.   REPEAT  EXAM:   _______________________________ Rosalie DoctorJonette Eva, MD 12/13/2012 3:38 PM Revised: 12/13/2012 3:38 PM

## 2012-12-13 NOTE — Op Note (Addendum)
[facility] [address] [place], [address]   COLONOSCOPY PROCEDURE REPORT  PATIENT: Jodi, Jimenez  MR#: [id number] BIRTHDATE: 25-Mar-1961 , 51  yrs. old GENDER: Female ENDOSCOPIST: Jonette Eva, MD REFERRED GL:OVFI Hall, M.D. PROCEDURE DATE:  12/13/2012 PROCEDURE:   Colonoscopy with biopsy INDICATIONS:unexplained BLOODY diarrhea.  PSHx: CHOLECYSTECTOMY MEDICATIONS: Demerol 125 mg IV and Versed 8 mg IV  DESCRIPTION OF PROCEDURE:    Physical exam was performed.  Informed consent was obtained from the patient after explaining the benefits, risks, and alternatives to procedure.  The patient was connected to monitor and placed in left lateral position. Continuous oxygen was provided by nasal cannula and IV medicine administered through an indwelling cannula.  After administration of sedation and rectal exam, the patients rectum was intubated and the [id number] ([id number])  colonoscope was advanced under direct visualization to the ileum.  The scope was removed slowly by carefully examining the color, texture, anatomy, and integrity mucosa on the way out.  The patient was recovered in endoscopy and discharged home in satisfactory condition.    COLON FINDINGS: The mucosa appeared normal in the terminal ileum.  , A sessile polyp measuring 3 mm in size was found in the rectum. Multiple biopsies were performed using cold forceps.  Moderate sized internal hemorrhoids were found.  PREP QUALITY: good. CECAL W/D TIME: 10 minutes     COMPLICATIONS: NAUSEAOUS AFTERWARDS. BETTER WITH ZOFRAN.  ENDOSCOPIC IMPRESSION: 1.   Normal mucosa in the terminal ileum 2.   ONE POLYP in the rectum 3.   Moderate sized internal hemorrhoids 4.  NO SOURCE FOR DIARRHEA IDENTIFIED-MOST LIKELY DUE TO BILE SALT INDUCED DIARRHEA  RECOMMENDATIONS: FOLLOW A HIGH FIBER//LOW FAT DIET.  AVOID ITEMS THAT CAUSE BLOATING.  CHEW ONE TUMS WITH MEALS THREE TIMES A DAY. USE IMODIUM AS NEEDED 30 MINS  PRIOR TO MEALS UP TO THREE TIMES A DAY. BIOPSY RESULTS SHOULD BE BACK IN 7 DAYS. FOLLOW UP IN 2 MOS.  Next colonoscopy in 10 years.       _______________________________ Rosalie DoctorJonette Eva, MD 12/13/2012 3:39 PM Revised: 12/13/2012 3:39 PM

## 2012-12-13 NOTE — OR Nursing (Signed)
Patient went to bathroom and complain of nausea was alittle clammy. Dr fields ordered zofran

## 2012-12-13 NOTE — H&P (Signed)
  Primary Care Physician:  Catalina Pizza, MD Primary Gastroenterologist:  Dr. Darrick Penna  Pre-Procedure History & Physical: HPI:  Jodi Jimenez is a 52 y.o. female here for Diarrhea/BRBPR.  Past Medical History  Diagnosis Date  . HTN (hypertension)   . Syncope and collapse     Dr. Clide Cliff, tilt table test+    Past Surgical History  Procedure Laterality Date  . Cholecystectomy    . Vaginal hysterectomy  2006    with bladder sling  . Take down of bladder sling  2006  . Rectocele repair  2006    Prior to Admission medications   Medication Sig Start Date End Date Taking? Authorizing Provider  ibuprofen (ADVIL,MOTRIN) 200 MG tablet Take 200 mg by mouth every 6 (six) hours as needed for pain.   Yes Historical Provider, MD  lisinopril (PRINIVIL,ZESTRIL) 30 MG tablet Take 30 mg by mouth daily.  12/05/12  Yes Historical Provider, MD  Melatonin 5 MG TABS Take 10 mg by mouth at bedtime.   Yes Historical Provider, MD    Allergies as of 12/08/2012 - Review Complete 12/08/2012  Allergen Reaction Noted  . Codeine Nausea And Vomiting     Family History  Problem Relation Age of Onset  . Heart failure      x3 maternal side  . Heart attack    . Diabetes    . Heart disease      paternal side  . Colon cancer Neg Hx     History   Social History  . Marital Status: Married    Spouse Name: N/A    Number of Children: 3  . 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 Smoking status: Never Smoker   . Smokeless tobacco: Not on file  . Alcohol Use: Yes     Comment: occasionally, very rare  . Drug Use: No  . Sexual Activity: Not on file   Other Topics Concern  . Not on file   Social History Narrative  . No narrative on file    Review of Systems: See HPI, otherwise negative ROS   Physical Exam: BP 153/87  Pulse 73  Temp(Src) 98 F (36.7 C) (Oral)  Resp 21  Ht 5\' 3"  (1.6 m)  Wt 187 lb (84.823 kg)  BMI 33.13 kg/m2   SpO2 97% General:   Alert,  pleasant and cooperative in NAD Head:  Normocephalic and atraumatic. Neck:  Supple; Lungs:  Clear throughout to auscultation.    Heart:  Regular rate and rhythm. Abdomen:  Soft, nontender and nondistended. Normal bowel sounds, without guarding, and without rebound.   Neurologic:  Alert and  oriented x4;  grossly normal neurologically.  Impression/Plan:     Diarrhea/BRBPR  PLAN: TCS TODAY WITH BIOPSY

## 2012-12-14 ENCOUNTER — Encounter (HOSPITAL_COMMUNITY): Payer: Self-pay | Admitting: Gastroenterology

## 2012-12-19 ENCOUNTER — Telehealth: Payer: Self-pay | Admitting: Gastroenterology

## 2012-12-19 DIAGNOSIS — B9681 Helicobacter pylori [H. pylori] as the cause of diseases classified elsewhere: Secondary | ICD-10-CM

## 2012-12-19 HISTORY — DX: Helicobacter pylori (H. pylori) as the cause of diseases classified elsewhere: B96.81

## 2012-12-19 MED ORDER — AMOXICILLIN 500 MG PO TABS: ORAL_TABLET | ORAL | Status: DC

## 2012-12-19 MED ORDER — CLARITHROMYCIN 500 MG PO TABS: ORAL_TABLET | ORAL | Status: DC

## 2012-12-19 MED ORDER — OMEPRAZOLE 20 MG PO CPDR: DELAYED_RELEASE_CAPSULE | ORAL | Status: DC

## 2012-12-19 NOTE — Progress Notes (Signed)
Quick Note:  Pt aware ______ 

## 2012-12-19 NOTE — Telephone Encounter (Signed)
PLEASE CALL PT. Her stomach Bx showed H. Pylori infection. Her colon and small bowel biopsies are normal. She had a simple adenoma removed from her colon.   She needs AMOXICILLIN 500 mg 2 po BID for 10 days and Biaxin 500 mg po bid for 10 days.. She needs Omeprazole 20 mg BID for 10 days then 1 po qd for 3 mos.. Med side effects include NVD, abd pain, and metallic taste.  FOLLOW A HIGH FIBER//LOW FAT DIET. AVOID ITEMS THAT CAUSE BLOATING.  CHEW ONE TUMS WITH MEALS THREE TIMES A DAY. USE IMODIUM AS NEEDED 30 MINS PRIOR TO MEALS UP  TO THREE TIMES A DAY.  FOLLOW UP IN 2 MOS E30 diarrhea w/ SLF.   Next colonoscopy in 10 years.

## 2012-12-19 NOTE — Progress Notes (Signed)
Quick Note:    Celiac screen: negative.  ______

## 2012-12-20 NOTE — Telephone Encounter (Signed)
Pt called back and is aware of results and Rx at the drug store for the H.Pylori.

## 2012-12-20 NOTE — Telephone Encounter (Signed)
Pt on recall list for tcs 10 years.

## 2012-12-20 NOTE — Telephone Encounter (Signed)
LMOM to call.

## 2013-02-15 ENCOUNTER — Telehealth: Payer: Self-pay

## 2013-02-15 ENCOUNTER — Encounter (INDEPENDENT_AMBULATORY_CARE_PROVIDER_SITE_OTHER): Payer: Self-pay

## 2013-02-15 ENCOUNTER — Ambulatory Visit (INDEPENDENT_AMBULATORY_CARE_PROVIDER_SITE_OTHER): Payer: BC Managed Care – PPO | Admitting: Gastroenterology

## 2013-02-15 ENCOUNTER — Encounter: Payer: Self-pay | Admitting: Gastroenterology

## 2013-02-15 VITALS — BP 146/83 | HR 74 | Temp 98.0°F | Wt 185.0 lb

## 2013-02-15 DIAGNOSIS — K219 Gastro-esophageal reflux disease without esophagitis: Secondary | ICD-10-CM

## 2013-02-15 DIAGNOSIS — R197 Diarrhea, unspecified: Secondary | ICD-10-CM

## 2013-02-15 MED ORDER — DICYCLOMINE HCL 10 MG PO CAPS: ORAL_CAPSULE | ORAL | Status: DC

## 2013-02-15 NOTE — Assessment & Plan Note (Signed)
MOST LIKELY DUE TO FOOD INTOLERANCE(LACTOSE) AND/0R BILE SALT DIARRHEA  START DICYCLOMINE 10 MG TABLETS ONE  30 MINUTES PRIOR BREAKFAST AND LUNCH. IF IT WORKS AFTER 2 WEEKS, TAKE IT BEFORE SUPPER AND AT BEDTIMEIT MAY CAUSE DROWSINESS, DRY EYES/MOUTH, BLURRY VISION, OR DIFFICULTY URINATING.  DRINK WATER TO KEEP YOUR URINE LIGHT YELLOW. FOLLOW A LACTOSE FREE/LOW FAT DIET. SEE INFO BELOW.  USE IMODIUM AS NEEDED. CALL IN ONE MONTH IF DIARRHEA NOT IMPROVED. WILL INCREASE BENTYL.  FOLLOW UP IN 3 MOS. IF DIARRHE ANOT IMPROVED ADD COLESTID OR QUESTRAN.

## 2013-02-15 NOTE — Assessment & Plan Note (Signed)
SX FAIRLY WELL CONTROLLED.  FOLLOW A LOW FAT DIET. SEE INFO BELOW.  Use Prilosec 30 minutes prior to your first meal TO TREAT RELUX.   FOLLOW UP IN 3 MOS.

## 2013-02-15 NOTE — Telephone Encounter (Signed)
PLEASE CALL PT.  Rx sent.  

## 2013-02-15 NOTE — Progress Notes (Signed)
cc'd to pcp 

## 2013-02-15 NOTE — Progress Notes (Signed)
REVIEWED.  

## 2013-02-15 NOTE — Progress Notes (Signed)
  Subjective:    Patient ID: Jodi Jimenez, female    DOB: 05-Jan-1961, 52 y.o.   MRN: [id number]  Catalina Pizza, MD  HPI Everytime she eats she has a watery to soft BM. CAN HAPPEN IF SHE DRINKS. TRIED TUMS WITH MEALS DIDN'T HELP. USES IMODIUM WHICH HELPS. HAS NOT TRIED CUTTING OUT DAIRY IN DIET. HEARTBURN/BETTER WITH TUMS/PRILOSEC 100%. LOST 2 LBS SINCE AUG 2014. RARE NAUSEA. TOOK ABX FOR H PYLORI. TAKING PRILOSEC QOD. DIARRHEA NOT WORSE AFTER ABX.  PT DENIES FEVER, CHILLS, BRBPR, vomiting, melena, constipation, abd pain, problems swallowing, problems with sedation, OR heartburn or indigestion.  Past Medical History  Diagnosis Date  . HTN (hypertension)   . Syncope and collapse     Dr. Clide Cliff, tilt table test+   Past Surgical History  Procedure Laterality Date  . Cholecystectomy    . Vaginal hysterectomy  2006    with bladder sling  . Take down of bladder sling  2006  . Rectocele repair  2006  . Colonoscopy N/A 12/13/2012    Procedure: COLONOSCOPY;  Surgeon: West Bali, MD;  Location: [facility];  Service: Endoscopy;  Laterality: N/A;  10:45  . Esophagogastroduodenoscopy N/A 12/13/2012    Procedure: ESOPHAGOGASTRODUODENOSCOPY (EGD);  Surgeon: West Bali, MD;  Location: [facility];  Service: Endoscopy;  Laterality: N/A;   Allergies  Allergen Reactions  . Codeine Nausea And Vomiting   Current Outpatient Prescriptions  Medication Sig Dispense Refill  . ibuprofen (ADVIL,MOTRIN) 200 MG tablet Take 200 mg by mouth every 6 (six) hours as needed for pain.      Marland Kitchen lisinopril (PRINIVIL,ZESTRIL) 30 MG tablet Take 30 mg by mouth daily.       . Melatonin 5 MG TABS Take 10 mg by mouth at bedtime.      Marland Kitchen omeprazole (PRILOSEC) 20 MG capsule 1 po ONCE DAILY IF YOU ARE USING IBUPROFEN OR ASPIRIN PRODUCTS.Marland Kitchen         Review of Systems     Objective:   Physical Exam  Vitals reviewed. Constitutional: She is oriented to person, place, and time. She appears well-nourished. No  distress.  HENT:  Head: Normocephalic and atraumatic.  Mouth/Throat: Oropharynx is clear and moist. No oropharyngeal exudate.  Eyes: Pupils are equal, round, and reactive to light. No scleral icterus.  Neck: Normal range of motion. Neck supple.  Cardiovascular: Normal rate, regular rhythm and normal heart sounds.   Pulmonary/Chest: Effort normal and breath sounds normal. No respiratory distress.  Abdominal: Soft. Bowel sounds are normal. She exhibits no distension. There is no tenderness.  Musculoskeletal: Normal range of motion. She exhibits no edema.  Lymphadenopathy:    She has no cervical adenopathy.  Neurological: She is alert and oriented to person, place, and time.  NO FOCAL DEFICITS   Psychiatric: She has a normal mood and affect.          Assessment & Plan:

## 2013-02-15 NOTE — Addendum Note (Signed)
Addended by: West Bali on: 02/15/2013 09:13 PM   Modules accepted: Orders

## 2013-02-15 NOTE — Patient Instructions (Signed)
START DICYCLOMINE 10 MG TABLETS ONE  30 MINUTES PRIOR BREAKFAST AND LUNCH. IF IT WORKS AFTER 2 WEEKS, TAKE IT BEFORE SUPPER AND AT BEDTIMEIT MAY CAUSE DROWSINESS, DRY EYES/MOUTH, BLURRY VISION, OR DIFFICULTY URINATING.  DRINK WATER TO KEEP YOUR URINE LIGHT YELLOW.  FOLLOW A LACTOSE FREE/LOW FAT DIET. SEE INFO BELOW.   USE IMODIUM & TUMS AS NEEDED.  CALL IN ONE MONTH IF DIARRHEA NOT IMPROVED.  Use Prilosec 30 minutes prior to your first meal TO PREVENT ULCERS. GASTRITIS, AND TO TREAT RELUX.   FOLLOW UP IN 3 MOS.    Lactose Free Diet Lactose is a carbohydrate that is found mainly in milk and milk products, as well as in foods with added milk or whey. Lactose must be digested by the enzyme in order to be used by the body. Lactose intolerance occurs when there is a shortage of lactase. When your body is not able to digest lactose, you may feel sick to your stomach (nausea), bloating, cramping, gas and diarrhea.  There are many dairy products that may be tolerated better than milk by some people:  The use of cultured dairy products such as yogurt, buttermilk, cottage cheese, and sweet acidophilus milk (Kefir) for lactase-deficient individuals is usually well tolerated. This is because the healthy bacteria help digest lactose.   Lactose-hydrolyzed milk (Lactaid) contains 40-90% less lactose than milk and may also be well tolerated.    SPECIAL NOTES  Lactose is a carbohydrates. The major food source is dairy products. Reading food labels is important. Many products contain lactose even when they are not made from milk. Look for the following words: whey, milk solids, dry milk solids, nonfat dry milk powder. Typical sources of lactose other than dairy products include breads, candies, cold cuts, prepared and processed foods, and commercial sauces and gravies.   All foods must be prepared without milk, cream, or other dairy foods.   Soy milk and lactose-free supplements (LACTASE) may be used  as an alternative to milk.   FOOD GROUP ALLOWED/RECOMMENDED AVOID/USE SPARINGLY  BREADS / STARCHES 4 servings or more* Breads and rolls made without milk. [place], [place], or [place] bread. Breads and rolls that contain milk. Prepared mixes such as muffins, biscuits, waffles, pancakes. Sweet rolls, donuts, [place] toast (if made with milk or lactose).  Crackers: Soda crackers, graham crackers. Any crackers prepared without lactose. Zwieback crackers, corn curls, or any that contain lactose.  Cereals: Cooked or dry cereals prepared without lactose (read labels). Cooked or dry cereals prepared with lactose (read labels). Total, Cocoa Krispies. Special K.  Potatoes / Pasta / Rice: Any prepared without milk or lactose. Popcorn. Instant potatoes, frozen [place] fries, scalloped or au gratin potatoes.  VEGETABLES 2 servings or more Fresh, frozen, and canned vegetables. Creamed or breaded vegetables. Vegetables in a cheese sauce or with lactose-containing margarines.  FRUIT 2 servings or more All fresh, canned, or frozen fruits that are not processed with lactose. Any canned or frozen fruits processed with lactose.  MEAT & SUBSTITUTES 2 servings or more (4 to 6 oz. total per day) Plain beef, chicken, fish, [place], lamb, veal, pork, or ham. Kosher prepared meat products. Strained or junior meats that do not contain milk. Eggs, soy meat substitutes, nuts. Scrambled eggs, omelets, and souffles that contain milk. Creamed or breaded meat, fish, or fowl. Sausage products such as wieners, liver sausage, or cold cuts that contain milk solids. Cheese, cottage cheese, or cheese spreads.  MILK None. (See "BEVERAGES" for milk substitutes. See "DESSERTS" for  ice cream and frozen desserts.) Milk (whole, 2%, skim, or chocolate). Evaporated, powdered, or condensed milk; malted milk.  SOUPS & COMBINATION FOODS Bouillon, broth, vegetable soups, clear soups, consomms. Homemade soups made with allowed ingredients. Combination  or prepared foods that do not contain milk or milk products (read labels). Cream soups, chowders, commercially prepared soups containing lactose. Macaroni and cheese, pizza. Combination or prepared foods that contain milk or milk products.  DESSERTS & SWEETS In moderation Water and fruit ices; gelatin; angel food cake. Homemade cookies, pies, or cakes made from allowed ingredients. Pudding (if made with water or a milk substitute). Lactose-free tofu desserts. Sugar, honey, corn syrup, jam, jelly; marmalade; molasses (beet sugar); Pure sugar candy; marshmallows. Ice cream, ice milk, sherbet, custard, pudding, frozen yogurt. Commercial cake and cookie mixes. Desserts that contain chocolate. Pie crust made with milk-containing margarine; reduced-calorie desserts made with a sugar substitute that contains lactose. Toffee, peppermint, butterscotch, chocolate, caramels.  FATS & OILS In moderation Butter (as tolerated; contains very small amounts of lactose). Margarines and dressings that do not contain milk, Vegetable oils, shortening, Miracle Whip, mayonnaise, nondairy cream & whipped toppings without lactose or milk solids added (examples: Coffee Rich, Carnation Coffeemate, Rich's Whipped Topping, PolyRich). Tomasa Blase. Margarines and salad dressings containing milk; cream, cream cheese; peanut butter with added milk solids, sour cream, chip dips, made with sour cream.  BEVERAGES Carbonated drinks; tea; coffee and freeze-dried coffee; some instant coffees (check labels). Fruit drinks; fruit and vegetable juice; Rice or Soy milk. Ovaltine, hot chocolate. Some cocoas; some instant coffees; instant iced teas; powdered fruit drinks (read labels).   CONDIMENTS / MISCELLANEOUS Soy sauce, carob powder, olives, gravy made with water, baker's cocoa, pickles, pure seasonings and spices, wine, pure monosodium glutamate, catsup, mustard. Some chewing gums, chocolate, some cocoas. Certain antibiotics and vitamin / mineral  preparations. Spice blends if they contain milk products. MSG extender. Artificial sweeteners that contain lactose such as Equal (Nutra-Sweet) and Sweet 'n Low. Some nondairy creamers (read labels).   SAMPLE MENU*  Breakfast   Orange Juice.  Banana.   Bran flakes.   Nondairy Creamer.  Vienna Bread (toasted).   Butter or milk-free margarine.   Coffee or tea.    Noon Meal   Chicken Breast.  Rice.   Green beans.   Butter or milk-free margarine.  Fresh melon.   Coffee or tea.    Evening Meal   Roast Beef.  Baked potato.   Butter or milk-free margarine.   Broccoli.   Lettuce salad with vinegar and oil dressing.  [employer].   Coffee or tea.     Low-Fat Diet BREADS, CEREALS, PASTA, RICE, DRIED PEAS, AND BEANS These products are high in carbohydrates and most are low in fat. Therefore, they can be increased in the diet as substitutes for fatty foods. They too, however, contain calories and should not be eaten in excess. Cereals can be eaten for snacks as well as for breakfast.   FRUITS AND VEGETABLES It is good to eat fruits and vegetables. Besides being sources of fiber, both are rich in vitamins and some minerals. They help you get the daily allowances of these nutrients. Fruits and vegetables can be used for snacks and desserts.  MEATS Limit lean meat, chicken, [place], and fish to no more than 6 ounces per day. Beef, Pork, and Lamb Use lean cuts of beef, pork, and lamb. Lean cuts include:  Extra-lean ground beef.  Arm roast.  Sirloin tip.  Center-cut ham.  Round  steak.  Loin chops.  Rump roast.  Tenderloin.  Trim all fat off the outside of meats before cooking. It is not necessary to severely decrease the intake of red meat, but lean choices should be made. Lean meat is rich in protein and contains a highly absorbable form of iron. Premenopausal women, in particular, should avoid reducing lean red meat because this could increase the risk for low  red blood cells (iron-deficiency anemia).  Chicken and [place] These are good sources of protein. The fat of poultry can be reduced by removing the skin and underlying fat layers before cooking. Chicken and [place] can be substituted for lean red meat in the diet. Poultry should not be fried or covered with high-fat sauces. Fish and Shellfish Fish is a good source of protein. Shellfish contain cholesterol, but they usually are low in saturated fatty acids. The preparation of fish is important. Like chicken and [place], they should not be fried or covered with high-fat sauces. EGGS Egg whites contain no fat or cholesterol. They can be eaten often. Try 1 to 2 egg whites instead of whole eggs in recipes or use egg substitutes that do not contain yolk. MILK AND DAIRY PRODUCTS Use skim or 1% milk instead of 2% or whole milk. Decrease whole milk, natural, and processed cheeses. Use nonfat or low-fat (2%) cottage cheese or low-fat cheeses made from vegetable oils. Choose nonfat or low-fat (1 to 2%) yogurt. Experiment with evaporated skim milk in recipes that call for heavy cream. Substitute low-fat yogurt or low-fat cottage cheese for sour cream in dips and salad dressings. Have at least 2 servings of low-fat dairy products, such as 2 glasses of skim (or 1%) milk each day to help get your daily calcium intake. FATS AND OILS Reduce the total intake of fats, especially saturated fat. Butterfat, lard, and beef fats are high in saturated fat and cholesterol. These should be avoided as much as possible. Vegetable fats do not contain cholesterol, but certain vegetable fats, such as coconut oil, palm oil, and palm kernel oil are very high in saturated fats. These should be limited. These fats are often used in bakery goods, processed foods, popcorn, oils, and nondairy creamers. Vegetable shortenings and some peanut butters contain hydrogenated oils, which are also saturated fats. Read the labels on these foods and check  for saturated vegetable oils. Unsaturated vegetable oils and fats do not raise blood cholesterol. However, they should be limited because they are fats and are high in calories. Total fat should still be limited to 30% of your daily caloric intake. Desirable liquid vegetable oils are corn oil, cottonseed oil, olive oil, canola oil, safflower oil, soybean oil, and sunflower oil. Peanut oil is not as good, but small amounts are acceptable. Buy a heart-healthy tub margarine that has no partially hydrogenated oils in the ingredients. Mayonnaise and salad dressings often are made from unsaturated fats, but they should also be limited because of their high calorie and fat content. Seeds, nuts, peanut butter, olives, and avocados are high in fat, but the fat is mainly the unsaturated type. These foods should be limited mainly to avoid excess calories and fat. OTHER EATING TIPS Snacks  Most sweets should be limited as snacks. They tend to be rich in calories and fats, and their caloric content outweighs their nutritional value. Some good choices in snacks are graham crackers, melba toast, soda crackers, bagels (no egg), English muffins, fruits, and vegetables. These snacks are preferable to snack crackers, [place] fries, TORTILLA  CHIPS, and POTATO chips. Popcorn should be air-popped or cooked in small amounts of liquid vegetable oil. Desserts Eat fruit, low-fat yogurt, and fruit ices instead of pastries, cake, and cookies. Sherbet, angel food cake, gelatin dessert, frozen low-fat yogurt, or other frozen products that do not contain saturated fat (pure fruit juice bars, frozen ice pops) are also acceptable.  COOKING METHODS Choose those methods that use little or no fat. They include: Poaching.  Braising.  Steaming.  Grilling.  Baking.  Stir-frying.  Broiling.  Microwaving.  Foods can be cooked in a nonstick pan without added fat, or use a nonfat cooking spray in regular cookware. Limit fried foods and avoid  frying in saturated fat. Add moisture to lean meats by using water, broth, cooking wines, and other nonfat or low-fat sauces along with the cooking methods mentioned above. Soups and stews should be chilled after cooking. The fat that forms on top after a few hours in the refrigerator should be skimmed off. When preparing meals, avoid using excess salt. Salt can contribute to raising blood pressure in some people.  EATING AWAY FROM HOME Order entres, potatoes, and vegetables without sauces or butter. When meat exceeds the size of a deck of cards (3 to 4 ounces), the rest can be taken home for another meal. Choose vegetable or fruit salads and ask for low-calorie salad dressings to be served on the side. Use dressings sparingly. Limit high-fat toppings, such as bacon, crumbled eggs, cheese, sunflower seeds, and olives. Ask for heart-healthy tub margarine instead of butter.

## 2013-02-15 NOTE — Telephone Encounter (Signed)
Pt left a voice mail that she was supposed to have had a new medication sent to [facility] today and they do not have it yet. Please advise!

## 2013-02-20 NOTE — Telephone Encounter (Signed)
LMOM for pt.  

## 2013-02-21 NOTE — Progress Notes (Signed)
Reminder in Epic 

## 2015-01-11 ENCOUNTER — Other Ambulatory Visit: Payer: Self-pay | Admitting: Obstetrics & Gynecology

## 2015-01-16 ENCOUNTER — Encounter (HOSPITAL_COMMUNITY)
Admission: RE | Admit: 2015-01-16 | Discharge: 2015-01-16 | Disposition: A | Discharge status: Home / Self Care | Payer: BLUE CROSS/BLUE SHIELD | Source: Ambulatory Visit | Attending: Obstetrics & Gynecology | Admitting: Obstetrics & Gynecology

## 2015-01-16 ENCOUNTER — Other Ambulatory Visit: Payer: Self-pay

## 2015-01-16 ENCOUNTER — Encounter (HOSPITAL_COMMUNITY): Payer: Self-pay

## 2015-01-16 DIAGNOSIS — N814 Uterovaginal prolapse, unspecified: Secondary | ICD-10-CM | POA: Diagnosis not present

## 2015-01-16 DIAGNOSIS — Z01818 Encounter for other preprocedural examination: Secondary | ICD-10-CM | POA: Insufficient documentation

## 2015-01-16 HISTORY — DX: Nausea with vomiting, unspecified: Z98.890

## 2015-01-16 HISTORY — DX: Gastro-esophageal reflux disease without esophagitis: K21.9

## 2015-01-16 HISTORY — DX: Nausea with vomiting, unspecified: R11.2

## 2015-01-16 LAB — CBC
HCT: 40.9 % (ref 36.0–46.0)
HEMOGLOBIN: 13.6 g/dL (ref 12.0–15.0)
MCH: 30.9 pg (ref 26.0–34.0)
MCHC: 33.3 g/dL (ref 30.0–36.0)
MCV: 93 fL (ref 78.0–100.0)
PLATELETS: 254 10*3/uL (ref 150–400)
RBC: 4.4 MIL/uL (ref 3.87–5.11)
RDW: 13.1 % (ref 11.5–15.5)
WBC: 7.4 10*3/uL (ref 4.0–10.5)

## 2015-01-16 LAB — BASIC METABOLIC PANEL
ANION GAP: 10 (ref 5–15)
BUN: 13 mg/dL (ref 6–20)
CHLORIDE: 102 mmol/L (ref 101–111)
CO2: 27 mmol/L (ref 22–32)
Calcium: 9.2 mg/dL (ref 8.9–10.3)
Creatinine, Ser: 0.64 mg/dL (ref 0.44–1.00)
Glucose, Bld: 90 mg/dL (ref 65–99)
POTASSIUM: 3.3 mmol/L — AB (ref 3.5–5.1)
SODIUM: 139 mmol/L (ref 135–145)

## 2015-01-16 NOTE — Patient Instructions (Addendum)
Your procedure is scheduled on:  January 23, 2015  Enter through the Main Entrance of [facility] at:  7:30 am   Pick up the phone at the desk and dial [phone].  Call this number if you have problems the morning of surgery: [phone].  Remember: Do NOT eat food:  After midnight on Tuesday night  Do NOT drink clear liquids after:  Midnight on Tuesday night  Take these medicines the morning of surgery with a SIP OF WATER: Lisinopril    Do NOT wear jewelry (body piercing), metal hair clips/bobby pins, make-up, or nail polish. Do NOT wear lotions, powders, or perfumes.  You may wear deoderant. Do NOT shave for 48 hours prior to surgery. Do NOT bring valuables to the hospital. Contacts, dentures, or bridgework may not be worn into surgery. Leave suitcase in car.  After surgery it may be brought to your room.  For patients admitted to the hospital, checkout time is 11:00 AM the day of discharge.

## 2015-01-22 NOTE — Anesthesia Preprocedure Evaluation (Addendum)
Anesthesia Evaluation  Patient identified by MRN, date of birth, ID band Patient awake    Reviewed: Allergy & Precautions, NPO status , Patient's Chart, lab work & pertinent test results  History of Anesthesia Complications (+) PONV and history of anesthetic complications  Airway Mallampati: III  TM Distance: >3 FB Neck ROM: Full    Dental no notable dental hx. (+) Dental Advisory Given   Pulmonary neg pulmonary ROS,    Pulmonary exam normal breath sounds clear to auscultation       Cardiovascular hypertension, Pt. on medications Normal cardiovascular exam Rhythm:Regular Rate:Normal     Neuro/Psych negative neurological ROS  negative psych ROS   GI/Hepatic Neg liver ROS, GERD  ,  Endo/Other  negative endocrine ROS  Renal/GU negative Renal ROS  negative genitourinary   Musculoskeletal  (+) Arthritis ,   Abdominal (+) + obese,   Peds negative pediatric ROS (+)  Hematology negative hematology ROS (+)   Anesthesia Other Findings   Reproductive/Obstetrics negative OB ROS                            Anesthesia Physical Anesthesia Plan  ASA: II  Anesthesia Plan: General   Post-op Pain Management:    Induction: Intravenous  Airway Management Planned: Oral ETT  Additional Equipment:   Intra-op Plan:   Post-operative Plan: Extubation in OR  Informed Consent: I have reviewed the patients History and Physical, chart, labs and discussed the procedure including the risks, benefits and alternatives for the proposed anesthesia with the patient or authorized representative who has indicated his/her understanding and acceptance.   Dental advisory given  Plan Discussed with: CRNA  Anesthesia Plan Comments:        Anesthesia Quick Evaluation

## 2015-01-23 ENCOUNTER — Ambulatory Visit (HOSPITAL_COMMUNITY): Payer: BLUE CROSS/BLUE SHIELD | Admitting: Anesthesiology

## 2015-01-23 ENCOUNTER — Encounter (HOSPITAL_COMMUNITY): Payer: Self-pay | Admitting: *Deleted

## 2015-01-23 ENCOUNTER — Ambulatory Visit (HOSPITAL_COMMUNITY)
Admission: RE | Admit: 2015-01-23 | Discharge: 2015-01-24 | Disposition: A | Discharge status: Home / Self Care | Payer: BLUE CROSS/BLUE SHIELD | Source: Ambulatory Visit | Attending: Obstetrics & Gynecology | Admitting: Obstetrics & Gynecology

## 2015-01-23 ENCOUNTER — Encounter (HOSPITAL_COMMUNITY)
Admission: RE | Disposition: A | Discharge status: Home / Self Care | Payer: Self-pay | Source: Ambulatory Visit | Attending: Obstetrics & Gynecology

## 2015-01-23 DIAGNOSIS — Z9889 Other specified postprocedural states: Secondary | ICD-10-CM

## 2015-01-23 DIAGNOSIS — K219 Gastro-esophageal reflux disease without esophagitis: Secondary | ICD-10-CM | POA: Insufficient documentation

## 2015-01-23 DIAGNOSIS — I10 Essential (primary) hypertension: Secondary | ICD-10-CM | POA: Diagnosis not present

## 2015-01-23 DIAGNOSIS — Z90710 Acquired absence of both cervix and uterus: Secondary | ICD-10-CM | POA: Diagnosis not present

## 2015-01-23 DIAGNOSIS — N8110 Cystocele, unspecified: Secondary | ICD-10-CM | POA: Insufficient documentation

## 2015-01-23 DIAGNOSIS — Z885 Allergy status to narcotic agent status: Secondary | ICD-10-CM | POA: Diagnosis not present

## 2015-01-23 HISTORY — PX: CYSTOCELE REPAIR: SHX163

## 2015-01-23 LAB — COMPREHENSIVE METABOLIC PANEL
ALT: 24 U/L (ref 14–54)
ANION GAP: 7 (ref 5–15)
AST: 26 U/L (ref 15–41)
Albumin: 4.2 g/dL (ref 3.5–5.0)
Alkaline Phosphatase: 51 U/L (ref 38–126)
BUN: 10 mg/dL (ref 6–20)
CHLORIDE: 104 mmol/L (ref 101–111)
CO2: 24 mmol/L (ref 22–32)
Calcium: 8.7 mg/dL — ABNORMAL LOW (ref 8.9–10.3)
Creatinine, Ser: 0.67 mg/dL (ref 0.44–1.00)
Glucose, Bld: 104 mg/dL — ABNORMAL HIGH (ref 65–99)
POTASSIUM: 3.7 mmol/L (ref 3.5–5.1)
SODIUM: 135 mmol/L (ref 135–145)
Total Bilirubin: 0.6 mg/dL (ref 0.3–1.2)
Total Protein: 7 g/dL (ref 6.5–8.1)

## 2015-01-23 SURGERY — COLPORRHAPHY, ANTERIOR, FOR CYSTOCELE REPAIR
Anesthesia: General | Site: Vagina

## 2015-01-23 MED ORDER — ONDANSETRON HCL 4 MG/2ML IJ SOLN: 4.0000 mg | Freq: Once | INTRAMUSCULAR | Status: DC | PRN

## 2015-01-23 MED ORDER — MIDAZOLAM HCL 2 MG/2ML IJ SOLN
INTRAMUSCULAR | Status: AC
  Filled 2015-01-23: qty 4

## 2015-01-23 MED ORDER — DEXAMETHASONE SODIUM PHOSPHATE 4 MG/ML IJ SOLN
INTRAMUSCULAR | Status: AC
  Filled 2015-01-23: qty 1

## 2015-01-23 MED ORDER — CEFAZOLIN SODIUM-DEXTROSE 2-3 GM-% IV SOLR
INTRAVENOUS | Status: AC
  Filled 2015-01-23: qty 50

## 2015-01-23 MED ORDER — LIDOCAINE HCL (PF) 1 % IJ SOLN
INTRAMUSCULAR | Status: AC
  Filled 2015-01-23: qty 5

## 2015-01-23 MED ORDER — ESTRADIOL 0.1 MG/GM VA CREA
TOPICAL_CREAM | VAGINAL | Status: AC
  Filled 2015-01-23: qty 42.5

## 2015-01-23 MED ORDER — SCOPOLAMINE 1 MG/3DAYS TD PT72
MEDICATED_PATCH | TRANSDERMAL | Status: AC
  Filled 2015-01-23: qty 1

## 2015-01-23 MED ORDER — SCOPOLAMINE 1 MG/3DAYS TD PT72
1.0000 | MEDICATED_PATCH | Freq: Once | TRANSDERMAL | Status: DC
  Administered 2015-01-23: 1.5 mg via TRANSDERMAL

## 2015-01-23 MED ORDER — OXYCODONE-ACETAMINOPHEN 5-325 MG PO TABS
1.0000 | ORAL_TABLET | ORAL | Status: DC | PRN
  Administered 2015-01-23 – 2015-01-24 (×2): 1 via ORAL
  Filled 2015-01-23 (×2): qty 1

## 2015-01-23 MED ORDER — PROPOFOL 10 MG/ML IV BOLUS
INTRAVENOUS | Status: DC | PRN
  Administered 2015-01-23: 200 mg via INTRAVENOUS

## 2015-01-23 MED ORDER — BUPIVACAINE HCL (PF) 0.25 % IJ SOLN
INTRAMUSCULAR | Status: AC
Start: 2015-01-23 — End: 2015-01-23
  Filled 2015-01-23: qty 30

## 2015-01-23 MED ORDER — KETOROLAC TROMETHAMINE 30 MG/ML IJ SOLN
INTRAMUSCULAR | Status: AC
  Filled 2015-01-23: qty 1

## 2015-01-23 MED ORDER — GLYCOPYRROLATE 0.2 MG/ML IJ SOLN
INTRAMUSCULAR | Status: AC
  Filled 2015-01-23: qty 3

## 2015-01-23 MED ORDER — DEXAMETHASONE SODIUM PHOSPHATE 10 MG/ML IJ SOLN
INTRAMUSCULAR | Status: DC | PRN
  Administered 2015-01-23: 4 mg via INTRAVENOUS

## 2015-01-23 MED ORDER — NEOSTIGMINE METHYLSULFATE 10 MG/10ML IV SOLN
INTRAVENOUS | Status: AC
  Filled 2015-01-23: qty 1

## 2015-01-23 MED ORDER — ATROPINE SULFATE 0.4 MG/ML IJ SOLN
INTRAMUSCULAR | Status: DC | PRN
  Administered 2015-01-23: 0.2 mg via INTRAVENOUS

## 2015-01-23 MED ORDER — FENTANYL CITRATE (PF) 100 MCG/2ML IJ SOLN
INTRAMUSCULAR | Status: DC | PRN
  Administered 2015-01-23: 50 ug via INTRAVENOUS
  Administered 2015-01-23: 25 ug via INTRAVENOUS
  Administered 2015-01-23: 75 ug via INTRAVENOUS

## 2015-01-23 MED ORDER — LIDOCAINE HCL (CARDIAC) 20 MG/ML IV SOLN
INTRAVENOUS | Status: DC | PRN
  Administered 2015-01-23: 50 mg via INTRAVENOUS

## 2015-01-23 MED ORDER — IBUPROFEN 600 MG PO TABS
600.0000 mg | ORAL_TABLET | Freq: Four times a day (QID) | ORAL | Status: DC | PRN
  Administered 2015-01-23 – 2015-01-24 (×2): 600 mg via ORAL
  Filled 2015-01-23 (×2): qty 1

## 2015-01-23 MED ORDER — SUCCINYLCHOLINE CHLORIDE 20 MG/ML IJ SOLN
INTRAMUSCULAR | Status: AC
  Filled 2015-01-23: qty 1

## 2015-01-23 MED ORDER — ESTRADIOL 0.1 MG/GM VA CREA
TOPICAL_CREAM | VAGINAL | Status: DC | PRN
  Administered 2015-01-23: 1 via VAGINAL

## 2015-01-23 MED ORDER — FENTANYL CITRATE (PF) 100 MCG/2ML IJ SOLN
25.0000 ug | INTRAMUSCULAR | Status: DC | PRN
  Administered 2015-01-23 (×2): 50 ug via INTRAVENOUS

## 2015-01-23 MED ORDER — FENTANYL CITRATE (PF) 100 MCG/2ML IJ SOLN
INTRAMUSCULAR | Status: AC
  Administered 2015-01-23: 50 ug via INTRAVENOUS
  Filled 2015-01-23: qty 2

## 2015-01-23 MED ORDER — LACTATED RINGERS IV SOLN
INTRAVENOUS | Status: DC
  Administered 2015-01-23 (×2): via INTRAVENOUS

## 2015-01-23 MED ORDER — LIDOCAINE-EPINEPHRINE 1 %-1:100000 IJ SOLN
INTRAMUSCULAR | Status: DC | PRN
  Administered 2015-01-23: 20 mL

## 2015-01-23 MED ORDER — ONDANSETRON HCL 4 MG/2ML IJ SOLN
INTRAMUSCULAR | Status: AC
  Filled 2015-01-23: qty 2

## 2015-01-23 MED ORDER — KETOROLAC TROMETHAMINE 30 MG/ML IJ SOLN
INTRAMUSCULAR | Status: DC | PRN
  Administered 2015-01-23: 30 mg via INTRAVENOUS

## 2015-01-23 MED ORDER — LIDOCAINE-EPINEPHRINE 1 %-1:100000 IJ SOLN
INTRAMUSCULAR | Status: AC
  Filled 2015-01-23: qty 1

## 2015-01-23 MED ORDER — LISINOPRIL 20 MG PO TABS
30.0000 mg | ORAL_TABLET | Freq: Every day | ORAL | Status: DC
  Filled 2015-01-23 (×2): qty 1

## 2015-01-23 MED ORDER — FENTANYL CITRATE (PF) 250 MCG/5ML IJ SOLN
INTRAMUSCULAR | Status: AC
  Filled 2015-01-23: qty 25

## 2015-01-23 MED ORDER — LACTATED RINGERS IV SOLN
INTRAVENOUS | Status: DC
  Administered 2015-01-23: 23:00:00 via INTRAVENOUS

## 2015-01-23 MED ORDER — HYDROMORPHONE HCL 1 MG/ML IJ SOLN
1.0000 mg | INTRAMUSCULAR | Status: DC | PRN
  Administered 2015-01-24: 1 mg via INTRAVENOUS
  Filled 2015-01-23: qty 1

## 2015-01-23 MED ORDER — EPHEDRINE SULFATE 50 MG/ML IJ SOLN
INTRAMUSCULAR | Status: DC | PRN
  Administered 2015-01-23 (×2): 5 mg via INTRAVENOUS

## 2015-01-23 MED ORDER — CEFAZOLIN SODIUM-DEXTROSE 2-3 GM-% IV SOLR
2.0000 g | INTRAVENOUS | Status: AC
  Administered 2015-01-23: 2 g via INTRAVENOUS

## 2015-01-23 MED ORDER — ONDANSETRON HCL 4 MG/2ML IJ SOLN
INTRAMUSCULAR | Status: DC | PRN
  Administered 2015-01-23: 4 mg via INTRAVENOUS

## 2015-01-23 MED ORDER — PROPOFOL 10 MG/ML IV BOLUS
INTRAVENOUS | Status: AC
  Filled 2015-01-23: qty 20

## 2015-01-23 MED ORDER — MIDAZOLAM HCL 2 MG/2ML IJ SOLN
INTRAMUSCULAR | Status: DC | PRN
  Administered 2015-01-23: 2 mg via INTRAVENOUS

## 2015-01-23 SURGICAL SUPPLY — 32 items
BLADE SURG 15 STRL LF C SS BP (BLADE) ×1 IMPLANT
BLADE SURG 15 STRL SS (BLADE) ×6
CLOTH BEACON ORANGE TIMEOUT ST (SAFETY) ×3 IMPLANT
CONT PATH 16OZ SNAP LID 3702 (MISCELLANEOUS) IMPLANT
DECANTER SPIKE VIAL GLASS SM (MISCELLANEOUS) IMPLANT
GAUZE PACKING 1/2X5YD (GAUZE/BANDAGES/DRESSINGS) ×4 IMPLANT
GLOVE BIO SURGEON STRL SZ 6.5 (GLOVE) ×2 IMPLANT
GLOVE BIO SURGEONS STRL SZ 6.5 (GLOVE) ×1
GLOVE BIOGEL PI IND STRL 7.0 (GLOVE) ×1 IMPLANT
GLOVE BIOGEL PI INDICATOR 7.0 (GLOVE) ×2
GOWN STRL REUS W/TWL LRG LVL3 (GOWN DISPOSABLE) ×12 IMPLANT
NDL SPNL 22GX3.5 QUINCKE BK (NEEDLE) IMPLANT
NEEDLE HYPO 22GX1.5 SAFETY (NEEDLE) ×3 IMPLANT
NEEDLE SPNL 22GX3.5 QUINCKE BK (NEEDLE) ×3 IMPLANT
PACK VAGINAL WOMENS (CUSTOM PROCEDURE TRAY) ×3 IMPLANT
SUT VIC AB 0 CT1 27 (SUTURE)
SUT VIC AB 0 CT1 27XBRD ANBCTR (SUTURE) IMPLANT
SUT VIC AB 2-0 CT1 27 (SUTURE) ×3
SUT VIC AB 2-0 CT1 TAPERPNT 27 (SUTURE) ×2 IMPLANT
SUT VIC AB 2-0 CT2 27 (SUTURE) IMPLANT
SUT VIC AB 2-0 SH 27 (SUTURE) ×9
SUT VIC AB 2-0 SH 27XBRD (SUTURE) ×3 IMPLANT
SUT VIC AB 2-0 UR5 27 (SUTURE) ×8 IMPLANT
SUT VIC AB 2-0 UR6 27 (SUTURE) ×4 IMPLANT
SUT VIC AB 3-0 SH 27 (SUTURE) ×3
SUT VIC AB 3-0 SH 27X BRD (SUTURE) IMPLANT
SUT VICRYL 0 UR6 27IN ABS (SUTURE) ×6 IMPLANT
SUT VICRYL 2 0 18  UND BR (SUTURE)
SUT VICRYL 2 0 18 UND BR (SUTURE) IMPLANT
TOWEL OR 17X24 6PK STRL BLUE (TOWEL DISPOSABLE) ×6 IMPLANT
TRAY FOLEY CATH SILVER 14FR (SET/KITS/TRAYS/PACK) ×3 IMPLANT
WATER STERILE IRR 1000ML POUR (IV SOLUTION) ×2 IMPLANT

## 2015-01-23 NOTE — H&P (Signed)
Jodi Jimenez is an 54 y.o. female  G3P3 married  RP:  Sxic Cystocele for Cystocele repair  Pertinent Gynecological History:  Blood transfusions:  None Sexually transmitted diseases: None Previous GYN Procedures: Vaginal Hysterectomy with Sling 2004 and Rectocele/Enterocele repair 2008 Last mammogram: wnl Last pap: wnl OB History:  G3P3   Menstrual History:  No LMP recorded. Patient has had a hysterectomy.    Past Medical History  Diagnosis Date  . HTN (hypertension)   . Syncope and collapse     Dr. Jens Som, tilt table test+  . Helicobacter pylori gastritis 12/19/2012    AUG 2014 EGD-ABO BID FOR 10 DAYS   . PONV (postoperative nausea and vomiting)   . GERD (gastroesophageal reflux disease)     resolved     Past Surgical History  Procedure Laterality Date  . Cholecystectomy    . Vaginal hysterectomy  2006    with bladder sling  . Take down of bladder sling  2006  . Rectocele repair  2006  . Colonoscopy N/A 12/13/2012    Procedure: COLONOSCOPY;  Surgeon: Danie Binder, MD;  Location: [facility];  Service: Endoscopy;  Laterality: N/A;  10:45  . Esophagogastroduodenoscopy N/A 12/13/2012    Procedure: ESOPHAGOGASTRODUODENOSCOPY (EGD);  Surgeon: Danie Binder, MD;  Location: [facility];  Service: Endoscopy;  Laterality: N/A;    Family History  Problem Relation Age of Onset  . Heart failure      x3 maternal side  . Heart attack    . Diabetes    . Heart disease      paternal side  . Colon cancer Neg Hx     Social History:  reports that she has never smoked. She has never used smokeless tobacco. She reports that she drinks alcohol. She reports that she does not use illicit drugs.  Allergies:  Allergies  Allergen Reactions  . Codeine Nausea And Vomiting    Prescriptions prior to admission  Medication Sig Dispense Refill Last Dose  . ibuprofen (ADVIL,MOTRIN) 200 MG tablet Take 200 mg by mouth every 6 (six) hours as needed for pain.   01/22/2015 at Unknown time   . lisinopril (PRINIVIL,ZESTRIL) 30 MG tablet Take 30 mg by mouth daily.    01/23/2015 at Unknown time  . OVER THE COUNTER MEDICATION Take 1 tablet by mouth daily. For hot flashes     . dicyclomine (BENTYL) 10 MG capsule 1-2 PO QAC AND HS. Start with a dose before breakfast & lunch. (Patient not taking: Reported on 01/15/2015) 62 capsule 11   . omeprazole (PRILOSEC) 20 MG capsule 1 po bid FOR 10 DAYS THEN ONE BID FOR 3 mos THEN ONCE DAILY IF YOU ARE USING IBUPROFEN OR ASPIRIN PRODUCTS.. (Patient not taking: Reported on 01/15/2015) 60 capsule 11 Taking    ROS neg.  No SUI.  Blood pressure 157/74, pulse 61, temperature 98.1 F (36.7 C), temperature source Oral, resp. rate 18, SpO2 99 %. Physical Exam  Cystocele grade 4/4.  No Rectocele.  Vaginal apex well supported.    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 s/p Vaginal Hysterectomy with Sling/Rectocele and Enterocele repair presenting for Cystocele repair.  Surgery and risks reviewed.  LAVOIE,MARIE-LYNE 01/23/2015, 8:15 AM

## 2015-01-23 NOTE — Anesthesia Procedure Notes (Signed)
Procedure Name: LMA Insertion Date/Time: 01/23/2015 9:40 AM Performed by: Bufford Spikes Pre-anesthesia Checklist: Patient identified, Patient being monitored, Emergency Drugs available, Timeout performed and Suction available Patient Re-evaluated:Patient Re-evaluated prior to inductionOxygen Delivery Method: Circle system utilized Preoxygenation: Pre-oxygenation with 100% oxygen Intubation Type: IV induction Ventilation: Mask ventilation without difficulty LMA: LMA inserted LMA Size: 4.0 Number of attempts: 1 Placement Confirmation: positive ETCO2 and breath sounds checked- equal and bilateral Tube secured with: Tape Dental Injury: Teeth and Oropharynx as per pre-operative assessment

## 2015-01-23 NOTE — Addendum Note (Signed)
Addendum  created 01/23/15 2113 by Flossie Dibble, CRNA   Modules edited: Notes Section   Notes Section:  File: [id number]

## 2015-01-23 NOTE — Op Note (Signed)
01/23/2015  11:15 AM  PATIENT:  Jodi Jimenez  54 y.o. female  PRE-OPERATIVE DIAGNOSIS:  Cystocele Grade 4/4  POST-OPERATIVE DIAGNOSIS:  Cystocele Grade 4/4  PROCEDURE:  Procedure(s): ANTERIOR REPAIR (CYSTOCELE)  SURGEON:  Surgeon(s): Princess Bruins, MD  ASSISTANTS: Hester Mates   ANESTHESIA:   general   PROCEDURE:  Under general anesthesia with endotracheal intubation the patient was in lithotomy position. She is prepped with Betadine on the suprapubic, vulvar and vaginal areas and draped as usual. The Foley is inserted in the bladder. The patient is status post vaginal hysterectomy with a sling procedure in 2004 and Rectocele/Enterocele repair in 2008. Currently the vaginal apex is just mildly descended with a cystocele grade 4/4 and no rectocele.  We put 2 Allises at about 1 cm above the apex anteriorly and one Allis at about 2 cm from the urethra on the anterior vaginal wall.  We used the knife to open the vaginal mucosa close to the apex and then used the Strully scissors to undermine the mucosa and cut the mucosa on the midline all the way to the Allis below the urethral meatus.  We used Allises to hold the 2 sides of the vaginal mucosa as we progressed.  We then used the knife and then the Metzenbaums scissors to detach the fascia from the vaginal mucosa at the edges and then separated the bladder from the vaginal mucosa using a sponge on the finger.  Hemostasis was adequate with minor touches with the Bovie.  We started on either side of the urethra with separate stitches of Vicryl 2-0.  We then did 2 circular stitches to reduce the cystocele with Vicryl 2-0.  We further reduced the cystocele with separate stitches of Vicryl 2-0 from side-to-side on the fascia close to the vaginal mucosa.  Once the cystocele was well reduced we trimmed the excess vaginal mucosa on each side.  We then closed the vaginal mucosa with a locked suture of Vicryl 2-0. We anchored it on the fascia on the  midline most of the time.  The cystocele was very well corrected with a good length of vagina.  Hemostasis was adequate at all levels.  We then packed the vagina with a half inch enrobed with estradiol cream.  All instruments were removed.The patient was brought to recovery room in good and stable status.  ESTIMATED BLOOD LOSS:  25 cc   Intake/Output Summary (Last 24 hours) at 01/23/15 1115 Last data filed at 01/23/15 1054  Gross per 24 hour  Intake   1250 ml  Output    175 ml  Net   1075 ml     BLOOD ADMINISTERED:none   LOCAL MEDICATIONS USED:  LIDOCAINE 1% with Epinephrine  SPECIMEN:  No Specimen  DISPOSITION OF SPECIMEN:  N/A  COUNTS:  YES  PLAN OF CARE: Transfer to PACU  Princess Bruins MD  01/23/2015 at 11:16 am

## 2015-01-23 NOTE — Transfer of Care (Signed)
Immediate Anesthesia Transfer of Care Note  Patient: Jodi Jimenez  Procedure(s) Performed: Procedure(s): ANTERIOR REPAIR (CYSTOCELE) (N/A)  Patient Location: PACU  Anesthesia Type:General  Level of Consciousness: awake, alert  and oriented  Airway & Oxygen Therapy: Patient Spontanous Breathing and Patient connected to nasal cannula oxygen  Post-op Assessment: Report given to RN and Post -op Vital signs reviewed and stable  Post vital signs: Reviewed and stable  Last Vitals:  Filed Vitals:   01/23/15 0730  BP: 157/74  Pulse: 61  Temp: 36.7 C  Resp: 18    Complications: No apparent anesthesia complications

## 2015-01-23 NOTE — Anesthesia Postprocedure Evaluation (Signed)
  Anesthesia Post-op Note  Patient: Jodi Jimenez  Procedure(s) Performed: Procedure(s) (LRB): ANTERIOR REPAIR (CYSTOCELE) (N/A)  Patient Location: PACU  Anesthesia Type: General  Level of Consciousness: awake and alert   Airway and Oxygen Therapy: Patient Spontanous Breathing  Post-op Pain: mild  Post-op Assessment: Post-op Vital signs reviewed, Patient's Cardiovascular Status Stable, Respiratory Function Stable, Patent Airway and No signs of Nausea or vomiting  Last Vitals:  Filed Vitals:   01/23/15 1130  BP: 127/68  Pulse: 72  Temp:   Resp: 16    Post-op Vital Signs: stable   Complications: No apparent anesthesia complications

## 2015-01-23 NOTE — Anesthesia Postprocedure Evaluation (Signed)
Anesthesia Post Note  Patient: Jodi Jimenez  Procedure(s) Performed: Procedure(s): ANTERIOR REPAIR (CYSTOCELE) (N/A)  Anesthesia type: General  Patient location: [facility]  Post pain: Pain level controlled  Post assessment: Post-op Vital signs reviewed  Last Vitals: BP 130/63 mmHg  Pulse 71  Temp(Src) 36.7 C (Oral)  Resp 18  Ht 5\' 3"  (1.6 m)  Wt 187 lb (84.823 kg)  BMI 33.13 kg/m2  SpO2 96%  Post vital signs: Reviewed  Level of consciousness: awake  Complications: No apparent anesthesia complications

## 2015-01-24 ENCOUNTER — Encounter (HOSPITAL_COMMUNITY): Payer: Self-pay | Admitting: Obstetrics & Gynecology

## 2015-01-24 DIAGNOSIS — N8110 Cystocele, unspecified: Secondary | ICD-10-CM | POA: Diagnosis not present

## 2015-01-24 LAB — CBC
HEMATOCRIT: 34.6 % — AB (ref 36.0–46.0)
HEMOGLOBIN: 11.1 g/dL — AB (ref 12.0–15.0)
MCH: 30.6 pg (ref 26.0–34.0)
MCHC: 32.1 g/dL (ref 30.0–36.0)
MCV: 95.3 fL (ref 78.0–100.0)
Platelets: 215 10*3/uL (ref 150–400)
RBC: 3.63 MIL/uL — AB (ref 3.87–5.11)
RDW: 13.7 % (ref 11.5–15.5)
WBC: 12.7 10*3/uL — ABNORMAL HIGH (ref 4.0–10.5)

## 2015-01-24 MED ORDER — OXYCODONE-ACETAMINOPHEN 7.5-325 MG PO TABS: 1.0000 | ORAL_TABLET | ORAL | Status: DC | PRN

## 2015-01-24 NOTE — Discharge Summary (Signed)
  Physician Discharge Summary  Patient ID: Jodi Jimenez MRN: [id number] DOB/AGE: 53-Apr-1962 53 y.o.  Admit date: 01/23/2015 Discharge date: 01/24/2015  Admission Diagnoses: Cystocele Grade 4-4, Prolapse  Discharge Diagnoses: Cystocele Grade 4-4, Prolapse        Active Problems:   Postoperative state   Discharged Condition: good  Hospital Course: good  Consults: None  Treatments: surgery: Cystocele repair  Disposition: 01-Home or Self Care     Medication List    TAKE these medications        dicyclomine 10 MG capsule  Commonly known as:  BENTYL  1-2 PO QAC AND HS. Start with a dose before breakfast & lunch.     ibuprofen 200 MG tablet  Commonly known as:  ADVIL,MOTRIN  Take 200 mg by mouth every 6 (six) hours as needed for pain.     lisinopril 30 MG tablet  Commonly known as:  PRINIVIL,ZESTRIL  Take 30 mg by mouth daily.     omeprazole 20 MG capsule  Commonly known as:  PRILOSEC  1 po bid FOR 10 DAYS THEN ONE BID FOR 3 mos THEN ONCE DAILY IF YOU ARE USING IBUPROFEN OR ASPIRIN PRODUCTS.Marland Kitchen     OVER THE COUNTER MEDICATION  Take 1 tablet by mouth daily. For hot flashes     oxyCODONE-acetaminophen 7.5-325 MG tablet  Commonly known as:  PERCOCET  Take 1 tablet by mouth every 4 (four) hours as needed for severe pain.           Follow-up Information    Follow up with LAVOIE,MARIE-LYNE, MD In 3 weeks.   Specialty:  Obstetrics and Gynecology   Contact information:   [address] [place] [address] [phone]       Signed: Princess Bruins, MD 01/24/2015, 12:57 PM

## 2015-01-24 NOTE — Discharge Instructions (Signed)
Cystocele Repair, Care After °Refer to this sheet in the next few weeks. These instructions provide you with information on caring for yourself after your procedure. Your health care provider may also give you more specific instructions. Your treatment has been planned according to current medical practices, but problems sometimes occur. Call your health care provider if you have any problems or questions after your procedure. °WHAT TO EXPECT AFTER THE PROCEDURE °After your procedure, it is typical to have the following: °· Bloody discharge from the vagina for 1-2 weeks. °· A catheter in your bladder to drain urine as the bladder heals. You will be instructed on how to empty the bag. °HOME CARE INSTRUCTIONS  °· Only take over-the-counter or prescription medicines as directed by your health care provider. °· Do not take baths. Take showers until your health care provider tells you otherwise. °· Exercise as instructed. Do not perform any exercise that increases the pressure on your abdomen, such as lifting weights or doing sit-ups, until your health care provider has given you permission. °· You may resume your normal diet. Eat a well-balanced diet. °· Drink enough fluids to keep your urine clear or pale yellow. °· Avoid straining during bowel movements. If you become constipated, you may: °¨ Take a mild laxative if your health care provider approves. °¨ Add fruit and bran to your diet. °¨ Drink more fluids. °· Do not douche or have sexual intercourse for 6 weeks after your surgery. °· Follow up with your health care provider as directed. °SEEK MEDICAL CARE IF: °· You have nausea or vomiting. °· You have vaginal pain that is not relieved by pain medicines. °· You feel a burning sensation during urination or have frequent urination.   °SEEK IMMEDIATE MEDICAL CARE IF:  °· You have redness, swelling, or a bad-smelling discharge from the vagina. °· You notice a bad smell coming from the vagina. °· You have pus coming from  the vagina. °· You have a fever. °· You have abdominal pain. °· You have excessive vaginal bleeding. °· You have shortness of breath or chest pain. °MAKE SURE YOU: °· Understand these instructions. °· Will watch your condition. °· Will get help right away if you are not doing well or get worse. °Document Released: 10/31/2004 Document Revised: 04/18/2013 Document Reviewed: 09/30/2012 °ExitCare® Patient Information ©2015 [employer]. This information is not intended to replace advice given to you by your health care provider. Make sure you discuss any questions you have with your health care provider. ° °

## 2015-01-24 NOTE — Progress Notes (Signed)
1 Day Post-Op Procedure(s) (LRB): ANTERIOR REPAIR (CYSTOCELE) (N/A)  Subjective: Patient reports that pain is well managed.  Tolerating normal diet as tolerated  diet without difficulty. No nausea / vomiting.  Ambulating and voiding, voided x 4 x Foley d/ced this am.  Minimal vaginal spotting.  Objective: BP 140/67 mmHg  Pulse 65  Temp(Src) 98 F (36.7 C) (Oral)  Resp 18  Ht 5\' 3"  (1.6 m)  Wt 187 lb (84.823 kg)  BMI 33.13 kg/m2  SpO2 96% Lungs: clear Heart: normal rate and rhythm Abdomen:soft and appropriately tender Vaginal packing removed this am.  No vaginal bleeding. Extremities: Homans sign is negative, no sign of DVT   Results for JADESOLA, POYNTER (MRN [id number]) as of 01/24/2015 12:44  Ref. Range 01/24/2015 05:15  WBC Latest Ref Range: 4.0-10.5 K/uL 12.7 (H)  RBC Latest Ref Range: 3.87-5.11 MIL/uL 3.63 (L)  Hemoglobin Latest Ref Range: 12.0-15.0 g/dL 11.1 (L)  HCT Latest Ref Range: 36.0-46.0 % 34.6 (L)  MCV Latest Ref Range: 78.0-100.0 fL 95.3  MCH Latest Ref Range: 26.0-34.0 pg 30.6  MCHC Latest Ref Range: 30.0-36.0 g/dL 32.1  RDW Latest Ref Range: 11.5-15.5 % 13.7  Platelets Latest Ref Range: 150-400 K/uL 215    Assessment: s/p Procedure(s): ANTERIOR REPAIR (CYSTOCELE): progressing well  Plan: Discharge home     LAVOIE,MARIE-LYNE 01/24/2015, 12:45 PM

## 2015-01-24 NOTE — Progress Notes (Signed)
Patient discharged home with husband... Discharge instructions reviewed with patient and she verbalized understanding... Condition stable... No equipment... Ambulated to car with C. Riley, NT.  

## 2015-01-24 NOTE — Progress Notes (Signed)
Vaginal packing removed as ordered.  Patient tolerated well. Scant amount serosanguineous drainage noted on packing

## 2016-02-24 ENCOUNTER — Other Ambulatory Visit: Payer: Self-pay | Admitting: Obstetrics & Gynecology

## 2016-03-03 NOTE — Patient Instructions (Signed)
Your procedure is scheduled on:  Thursday, Nov. 16, 2017  Enter through the [employer] [facility] at:  11:30 AM  Pick up the phone at the desk and dial [phone].  Call this number if you have problems the morning of surgery: [phone].  Remember: Do NOT eat food:  After Midnight Wednesday  Do NOT drink clear liquids after:  9:00 AM day of surgery  Take these medicines the morning of surgery with a SIP OF WATER:  Lisinopril  Stop ALL herbal medications, Ibuprofen products, and Vitamin E at this time   Do NOT wear jewelry (body piercing), metal hair clips/bobby pins, make-up, or nail polish. Do NOT wear lotions, powders, or perfumes.  You may wear deodorant. Do NOT shave for 48 hours prior to surgery. Do NOT bring valuables to the hospital. Contacts, dentures, or bridgework may not be worn into surgery.  Leave suitcase in car.  After surgery it may be brought to your room.  For patients admitted to the hospital, checkout time is 11:00 AM the day of discharge.

## 2016-03-04 ENCOUNTER — Encounter (HOSPITAL_COMMUNITY)
Admission: RE | Admit: 2016-03-04 | Discharge: 2016-03-04 | Disposition: A | Discharge status: Home / Self Care | Payer: BLUE CROSS/BLUE SHIELD | Source: Ambulatory Visit | Attending: Obstetrics & Gynecology | Admitting: Obstetrics & Gynecology

## 2016-03-04 ENCOUNTER — Encounter (HOSPITAL_COMMUNITY): Payer: Self-pay

## 2016-03-04 DIAGNOSIS — Z01812 Encounter for preprocedural laboratory examination: Secondary | ICD-10-CM | POA: Diagnosis not present

## 2016-03-04 DIAGNOSIS — Z01810 Encounter for preprocedural cardiovascular examination: Secondary | ICD-10-CM | POA: Insufficient documentation

## 2016-03-04 HISTORY — DX: Congenital non-neoplastic nevus: Q82.5

## 2016-03-04 LAB — COMPREHENSIVE METABOLIC PANEL
ALBUMIN: 4.3 g/dL (ref 3.5–5.0)
ALK PHOS: 58 U/L (ref 38–126)
ALT: 25 U/L (ref 14–54)
ANION GAP: 8 (ref 5–15)
AST: 24 U/L (ref 15–41)
BILIRUBIN TOTAL: 0.6 mg/dL (ref 0.3–1.2)
BUN: 14 mg/dL (ref 6–20)
CALCIUM: 9.2 mg/dL (ref 8.9–10.3)
CO2: 27 mmol/L (ref 22–32)
Chloride: 105 mmol/L (ref 101–111)
Creatinine, Ser: 0.65 mg/dL (ref 0.44–1.00)
GFR calc non Af Amer: >60 mL/min (ref >60)
GLUCOSE: 101 mg/dL — AB (ref 65–99)
POTASSIUM: 3.6 mmol/L (ref 3.5–5.1)
SODIUM: 140 mmol/L (ref 135–145)
TOTAL PROTEIN: 7.4 g/dL (ref 6.5–8.1)

## 2016-03-04 LAB — TYPE AND SCREEN
ABO/RH(D): A POS
ANTIBODY SCREEN: NEGATIVE

## 2016-03-04 LAB — CBC
HCT: 41.2 % (ref 36.0–46.0)
Hemoglobin: 14.1 g/dL (ref 12.0–15.0)
MCH: 31.1 pg (ref 26.0–34.0)
MCHC: 34.2 g/dL (ref 30.0–36.0)
MCV: 90.9 fL (ref 78.0–100.0)
Platelets: 251 10*3/uL (ref 150–400)
RBC: 4.53 MIL/uL (ref 3.87–5.11)
RDW: 13.3 % (ref 11.5–15.5)
WBC: 6.6 10*3/uL (ref 4.0–10.5)

## 2016-03-04 LAB — ABO/RH: ABO/RH(D): A POS

## 2016-03-11 ENCOUNTER — Encounter (HOSPITAL_COMMUNITY): Payer: Self-pay | Admitting: *Deleted

## 2016-03-12 ENCOUNTER — Ambulatory Visit (HOSPITAL_COMMUNITY): Payer: BLUE CROSS/BLUE SHIELD | Admitting: Anesthesiology

## 2016-03-12 ENCOUNTER — Observation Stay (HOSPITAL_COMMUNITY)
Admission: RE | Admit: 2016-03-12 | Discharge: 2016-03-13 | Disposition: A | Discharge status: Home / Self Care | Payer: BLUE CROSS/BLUE SHIELD | Source: Ambulatory Visit | Attending: Obstetrics & Gynecology | Admitting: Obstetrics & Gynecology

## 2016-03-12 ENCOUNTER — Encounter (HOSPITAL_COMMUNITY): Payer: Self-pay | Admitting: Anesthesiology

## 2016-03-12 ENCOUNTER — Encounter (HOSPITAL_COMMUNITY)
Admission: RE | Disposition: A | Discharge status: Home / Self Care | Payer: Self-pay | Source: Ambulatory Visit | Attending: Obstetrics & Gynecology

## 2016-03-12 DIAGNOSIS — I10 Essential (primary) hypertension: Secondary | ICD-10-CM | POA: Diagnosis not present

## 2016-03-12 DIAGNOSIS — Z90710 Acquired absence of both cervix and uterus: Secondary | ICD-10-CM | POA: Diagnosis not present

## 2016-03-12 DIAGNOSIS — I739 Peripheral vascular disease, unspecified: Secondary | ICD-10-CM | POA: Diagnosis not present

## 2016-03-12 DIAGNOSIS — Z6833 Body mass index (BMI) 33.0-33.9, adult: Secondary | ICD-10-CM | POA: Insufficient documentation

## 2016-03-12 DIAGNOSIS — K219 Gastro-esophageal reflux disease without esophagitis: Secondary | ICD-10-CM | POA: Diagnosis not present

## 2016-03-12 DIAGNOSIS — N815 Vaginal enterocele: Secondary | ICD-10-CM | POA: Diagnosis present

## 2016-03-12 DIAGNOSIS — M1990 Unspecified osteoarthritis, unspecified site: Secondary | ICD-10-CM | POA: Diagnosis not present

## 2016-03-12 DIAGNOSIS — Q524 Other congenital malformations of vagina: Secondary | ICD-10-CM | POA: Diagnosis not present

## 2016-03-12 DIAGNOSIS — E669 Obesity, unspecified: Secondary | ICD-10-CM | POA: Diagnosis not present

## 2016-03-12 DIAGNOSIS — Z9889 Other specified postprocedural states: Secondary | ICD-10-CM

## 2016-03-12 DIAGNOSIS — Z885 Allergy status to narcotic agent status: Secondary | ICD-10-CM | POA: Diagnosis not present

## 2016-03-12 HISTORY — PX: ROBOTIC ASSISTED LAPAROSCOPIC SACROCOLPOPEXY: SHX5388

## 2016-03-12 SURGERY — ROBOTIC ASSISTED LAPAROSCOPIC SACROCOLPOPEXY
Anesthesia: General | Site: Abdomen

## 2016-03-12 MED ORDER — SUGAMMADEX SODIUM 200 MG/2ML IV SOLN
INTRAVENOUS | Status: DC | PRN
  Administered 2016-03-12: 170 mg via INTRAVENOUS

## 2016-03-12 MED ORDER — ONDANSETRON HCL 4 MG/2ML IJ SOLN
INTRAMUSCULAR | Status: AC
  Filled 2016-03-12: qty 2

## 2016-03-12 MED ORDER — MIDAZOLAM HCL 2 MG/2ML IJ SOLN
INTRAMUSCULAR | Status: AC
  Filled 2016-03-12: qty 2

## 2016-03-12 MED ORDER — CEFAZOLIN SODIUM-DEXTROSE 2-4 GM/100ML-% IV SOLN
2.0000 g | INTRAVENOUS | Status: AC
  Administered 2016-03-12: 2 g via INTRAVENOUS

## 2016-03-12 MED ORDER — HYDROMORPHONE HCL 1 MG/ML IJ SOLN
0.2500 mg | INTRAMUSCULAR | Status: DC | PRN
  Administered 2016-03-12 (×4): 0.5 mg via INTRAVENOUS

## 2016-03-12 MED ORDER — IBUPROFEN 600 MG PO TABS: 600.0000 mg | ORAL_TABLET | Freq: Four times a day (QID) | ORAL | Status: DC | PRN

## 2016-03-12 MED ORDER — KETOROLAC TROMETHAMINE 30 MG/ML IJ SOLN
INTRAMUSCULAR | Status: AC
  Filled 2016-03-12: qty 1

## 2016-03-12 MED ORDER — SODIUM CHLORIDE 0.9 % IJ SOLN
INTRAMUSCULAR | Status: AC
  Filled 2016-03-12: qty 30

## 2016-03-12 MED ORDER — KETOROLAC TROMETHAMINE 30 MG/ML IJ SOLN: 30.0000 mg | Freq: Once | INTRAMUSCULAR | Status: DC | PRN

## 2016-03-12 MED ORDER — OXYCODONE-ACETAMINOPHEN 5-325 MG PO TABS
1.0000 | ORAL_TABLET | ORAL | Status: DC | PRN
Start: 2016-03-12 — End: 2016-03-13
  Administered 2016-03-13: 1 via ORAL
  Filled 2016-03-12: qty 1

## 2016-03-12 MED ORDER — ROPIVACAINE HCL 5 MG/ML IJ SOLN
INTRAMUSCULAR | Status: DC | PRN
  Administered 2016-03-12: 60 mL

## 2016-03-12 MED ORDER — KETOROLAC TROMETHAMINE 30 MG/ML IJ SOLN
INTRAMUSCULAR | Status: DC | PRN
  Administered 2016-03-12: 30 mg via INTRAVENOUS

## 2016-03-12 MED ORDER — LISINOPRIL 20 MG PO TABS
30.0000 mg | ORAL_TABLET | Freq: Every day | ORAL | Status: DC
  Administered 2016-03-13: 30 mg via ORAL
  Filled 2016-03-12 (×2): qty 1

## 2016-03-12 MED ORDER — ONDANSETRON HCL 4 MG/2ML IJ SOLN
INTRAMUSCULAR | Status: DC | PRN
  Administered 2016-03-12: 4 mg via INTRAVENOUS

## 2016-03-12 MED ORDER — LACTATED RINGERS IR SOLN
Status: DC | PRN
  Administered 2016-03-12: 3000 mL

## 2016-03-12 MED ORDER — HYDROMORPHONE HCL 1 MG/ML IJ SOLN
INTRAMUSCULAR | Status: AC
  Filled 2016-03-12: qty 1

## 2016-03-12 MED ORDER — SUGAMMADEX SODIUM 200 MG/2ML IV SOLN
INTRAVENOUS | Status: AC
Start: 2016-03-12 — End: 2016-03-12
  Filled 2016-03-12: qty 2

## 2016-03-12 MED ORDER — PHENYLEPHRINE 40 MCG/ML (10ML) SYRINGE FOR IV PUSH (FOR BLOOD PRESSURE SUPPORT)
PREFILLED_SYRINGE | INTRAVENOUS | Status: AC
  Filled 2016-03-12: qty 10

## 2016-03-12 MED ORDER — PROPOFOL 10 MG/ML IV BOLUS
INTRAVENOUS | Status: DC | PRN
  Administered 2016-03-12: 170 mg via INTRAVENOUS

## 2016-03-12 MED ORDER — HYDROMORPHONE HCL 1 MG/ML IJ SOLN: 1.0000 mg | INTRAMUSCULAR | Status: DC | PRN

## 2016-03-12 MED ORDER — LIDOCAINE HCL (CARDIAC) 20 MG/ML IV SOLN
INTRAVENOUS | Status: AC
  Filled 2016-03-12: qty 5

## 2016-03-12 MED ORDER — ARTIFICIAL TEARS OP OINT
TOPICAL_OINTMENT | OPHTHALMIC | Status: AC
  Filled 2016-03-12: qty 3.5

## 2016-03-12 MED ORDER — PROPOFOL 10 MG/ML IV BOLUS
INTRAVENOUS | Status: AC
  Filled 2016-03-12: qty 20

## 2016-03-12 MED ORDER — SCOPOLAMINE 1 MG/3DAYS TD PT72
MEDICATED_PATCH | TRANSDERMAL | Status: AC
  Filled 2016-03-12: qty 1

## 2016-03-12 MED ORDER — FENTANYL CITRATE (PF) 250 MCG/5ML IJ SOLN
INTRAMUSCULAR | Status: AC
  Filled 2016-03-12: qty 5

## 2016-03-12 MED ORDER — FENTANYL CITRATE (PF) 100 MCG/2ML IJ SOLN
INTRAMUSCULAR | Status: DC | PRN
  Administered 2016-03-12 (×5): 50 ug via INTRAVENOUS

## 2016-03-12 MED ORDER — BUPIVACAINE HCL (PF) 0.25 % IJ SOLN
INTRAMUSCULAR | Status: DC | PRN
  Administered 2016-03-12: 12 mL

## 2016-03-12 MED ORDER — MEPERIDINE HCL 25 MG/ML IJ SOLN: 6.2500 mg | INTRAMUSCULAR | Status: DC | PRN

## 2016-03-12 MED ORDER — PROMETHAZINE HCL 25 MG/ML IJ SOLN: 6.2500 mg | INTRAMUSCULAR | Status: DC | PRN

## 2016-03-12 MED ORDER — DEXAMETHASONE SODIUM PHOSPHATE 4 MG/ML IJ SOLN
INTRAMUSCULAR | Status: AC
  Filled 2016-03-12: qty 1

## 2016-03-12 MED ORDER — ROCURONIUM BROMIDE 100 MG/10ML IV SOLN
INTRAVENOUS | Status: DC | PRN
  Administered 2016-03-12: 50 mg via INTRAVENOUS
  Administered 2016-03-12 (×2): 15 mg via INTRAVENOUS

## 2016-03-12 MED ORDER — LACTATED RINGERS IV SOLN
INTRAVENOUS | Status: DC
  Administered 2016-03-12 (×3): via INTRAVENOUS

## 2016-03-12 MED ORDER — DEXAMETHASONE SODIUM PHOSPHATE 10 MG/ML IJ SOLN
INTRAMUSCULAR | Status: DC | PRN
  Administered 2016-03-12: 4 mg via INTRAVENOUS

## 2016-03-12 MED ORDER — LACTATED RINGERS IV SOLN
INTRAVENOUS | Status: DC
  Administered 2016-03-12 – 2016-03-13 (×2): via INTRAVENOUS

## 2016-03-12 MED ORDER — PHENYLEPHRINE HCL 10 MG/ML IJ SOLN
INTRAMUSCULAR | Status: DC | PRN
  Administered 2016-03-12: 120 ug via INTRAVENOUS

## 2016-03-12 MED ORDER — ROCURONIUM BROMIDE 100 MG/10ML IV SOLN
INTRAVENOUS | Status: AC
  Filled 2016-03-12: qty 1

## 2016-03-12 MED ORDER — ROPIVACAINE HCL 5 MG/ML IJ SOLN
INTRAMUSCULAR | Status: AC
  Filled 2016-03-12: qty 30

## 2016-03-12 MED ORDER — LIDOCAINE HCL (CARDIAC) 20 MG/ML IV SOLN
INTRAVENOUS | Status: DC | PRN
  Administered 2016-03-12: 70 mg via INTRAVENOUS
  Administered 2016-03-12: 30 mg via INTRAVENOUS

## 2016-03-12 MED ORDER — BUPIVACAINE HCL (PF) 0.25 % IJ SOLN
INTRAMUSCULAR | Status: AC
  Filled 2016-03-12: qty 30

## 2016-03-12 MED ORDER — SCOPOLAMINE 1 MG/3DAYS TD PT72
1.0000 | MEDICATED_PATCH | Freq: Once | TRANSDERMAL | Status: DC
  Administered 2016-03-12: 1.5 mg via TRANSDERMAL

## 2016-03-12 MED ORDER — ACETAMINOPHEN 10 MG/ML IV SOLN
1000.0000 mg | Freq: Once | INTRAVENOUS | Status: AC
Start: 2016-03-12 — End: 2016-03-12
  Administered 2016-03-12: 1000 mg via INTRAVENOUS
  Filled 2016-03-12: qty 100

## 2016-03-12 MED ORDER — MIDAZOLAM HCL 2 MG/2ML IJ SOLN
INTRAMUSCULAR | Status: DC | PRN
  Administered 2016-03-12: 1 mg via INTRAVENOUS

## 2016-03-12 SURGICAL SUPPLY — 49 items
BARRIER ADHS 3X4 INTERCEED (GAUZE/BANDAGES/DRESSINGS) IMPLANT
BRR ADH 4X3 ABS CNTRL BYND (GAUZE/BANDAGES/DRESSINGS)
CLOTH BEACON ORANGE TIMEOUT ST (SAFETY) ×3 IMPLANT
COVER TIP SHEARS 8 DVNC (MISCELLANEOUS) ×1 IMPLANT
COVER TIP SHEARS 8MM DA VINCI (MISCELLANEOUS) ×2
DECANTER SPIKE VIAL GLASS SM (MISCELLANEOUS) ×6 IMPLANT
DEFOGGER SCOPE WARMER CLEARIFY (MISCELLANEOUS) ×3 IMPLANT
DRSG OPSITE POSTOP 3X4 (GAUZE/BANDAGES/DRESSINGS) ×3 IMPLANT
DURAPREP 26ML APPLICATOR (WOUND CARE) ×3 IMPLANT
GLOVE BIO SURGEON STRL SZ 6.5 (GLOVE) ×6 IMPLANT
GLOVE BIO SURGEONS STRL SZ 6.5 (GLOVE) ×3
GLOVE BIOGEL PI IND STRL 7.0 (GLOVE) ×5 IMPLANT
GLOVE BIOGEL PI INDICATOR 7.0 (GLOVE) ×10
KIT ACCESSORY DA VINCI DISP (KITS) ×2
KIT ACCESSORY DVNC DISP (KITS) ×1 IMPLANT
LEGGING LITHOTOMY PAIR STRL (DRAPES) ×3 IMPLANT
LIQUID BAND (GAUZE/BANDAGES/DRESSINGS) ×3 IMPLANT
MESH ARTISYN Y (Sling) ×2 IMPLANT
OCCLUDER COLPOPNEUMO (BALLOONS) IMPLANT
PACK ROBOT WH (CUSTOM PROCEDURE TRAY) ×3 IMPLANT
PACK ROBOTIC GOWN (GOWN DISPOSABLE) ×3 IMPLANT
PACK TRENDGUARD 450 HYBRID PRO (MISCELLANEOUS) IMPLANT
PACK TRENDGUARD 600 HYBRD PROC (MISCELLANEOUS) IMPLANT
PAD PREP 24X48 CUFFED NSTRL (MISCELLANEOUS) ×3 IMPLANT
PROTECTOR NERVE ULNAR (MISCELLANEOUS) ×6 IMPLANT
SET CYSTO W/LG BORE CLAMP LF (SET/KITS/TRAYS/PACK) ×2 IMPLANT
SET TRI-LUMEN FLTR TB AIRSEAL (TUBING) ×3 IMPLANT
SUT ETHIBOND 0 (SUTURE) IMPLANT
SUT GORETEX NAB #0 THX26 36IN (SUTURE) ×16 IMPLANT
SUT VIC AB 2-0 SH 27 (SUTURE)
SUT VIC AB 2-0 SH 27XBRD (SUTURE) IMPLANT
SUT VIC AB 4-0 PS2 27 (SUTURE) ×6 IMPLANT
SUT VICRYL 0 UR6 27IN ABS (SUTURE) ×3 IMPLANT
SUT VLOC 180 2-0 6IN GS21 (SUTURE) ×2 IMPLANT
SUT VLOC 180 2-0 9IN GS21 (SUTURE) IMPLANT
SYR 50ML LL SCALE MARK (SYRINGE) ×3 IMPLANT
SYSTEM CONVERTIBLE TROCAR (TROCAR) ×3 IMPLANT
TIP UTERINE 5.1X6CM LAV DISP (MISCELLANEOUS) IMPLANT
TIP UTERINE 6.7X10CM GRN DISP (MISCELLANEOUS) IMPLANT
TIP UTERINE 6.7X6CM WHT DISP (MISCELLANEOUS) IMPLANT
TIP UTERINE 6.7X8CM BLUE DISP (MISCELLANEOUS) IMPLANT
TOWEL OR 17X24 6PK STRL BLUE (TOWEL DISPOSABLE) ×9 IMPLANT
TRENDGUARD 450 HYBRID PRO PACK (MISCELLANEOUS)
TRENDGUARD 600 HYBRID PROC PK (MISCELLANEOUS)
TROCAR 12M 150ML BLUNT (TROCAR) ×3 IMPLANT
TROCAR DISP BLADELESS 8 DVNC (TROCAR) ×1 IMPLANT
TROCAR DISP BLADELESS 8MM (TROCAR) ×2
TROCAR PORT AIRSEAL 8X120 (TROCAR) ×3 IMPLANT
WATER STERILE IRR 1000ML POUR (IV SOLUTION) ×3 IMPLANT

## 2016-03-12 NOTE — Anesthesia Procedure Notes (Signed)
Procedure Name: Intubation Date/Time: 03/12/2016 12:54 PM Performed by: Tobin Chad Pre-anesthesia Checklist: Patient identified, Emergency Drugs available, Suction available and Patient being monitored Patient Re-evaluated:Patient Re-evaluated prior to inductionOxygen Delivery Method: Circle system utilized and Simple face mask Preoxygenation: Pre-oxygenation with 100% oxygen Intubation Type: IV induction and Inhalational induction Ventilation: Mask ventilation without difficulty Laryngoscope Size: Mac and 3 Grade View: Grade II Tube type: Oral Tube size: 7.0 mm Number of attempts: 1 Airway Equipment and Method: Stylet Placement Confirmation: ETT inserted through vocal cords under direct vision,  positive ETCO2 and breath sounds checked- equal and bilateral Secured at: 21 cm Tube secured with: Tape Dental Injury: Teeth and Oropharynx as per pre-operative assessment

## 2016-03-12 NOTE — OR Nursing (Signed)
Husband updated on surgical progress and patient condition

## 2016-03-12 NOTE — H&P (Signed)
Jodi Jimenez is an 55 y.o. female G3P3 married  RP:  Colpocele/Cystocele  Pertinent Gynecological History:  Blood transfusions: none Sexually transmitted diseases: no past history Last mammogram: normal  Last pap: normal  OB History: G3P3  Menstrual History:  No LMP recorded. Patient has had a hysterectomy.    Past Medical History:  Diagnosis Date  . GERD (gastroesophageal reflux disease)    resolved   . Helicobacter pylori gastritis 12/19/2012   AUG 2014 EGD-ABO BID FOR 10 DAYS   . HTN (hypertension)   . PONV (postoperative nausea and vomiting)   . Red birthmarks    left hand and forearm  . Syncope and collapse    Dr. Jens Som, tilt table test+    Past Surgical History:  Procedure Laterality Date  . CHOLECYSTECTOMY    . COLONOSCOPY N/A 12/13/2012   Procedure: COLONOSCOPY;  Surgeon: Danie Binder, MD;  Location: [facility];  Service: Endoscopy;  Laterality: N/A;  10:45  . CYSTOCELE REPAIR N/A 01/23/2015   Procedure: ANTERIOR REPAIR (CYSTOCELE);  Surgeon: Princess Bruins, MD;  Location: [facility];  Service: Gynecology;  Laterality: N/A;  . ELBOW ARTHROSCOPY Right    repair torn ligament  . ESOPHAGOGASTRODUODENOSCOPY N/A 12/13/2012   Procedure: ESOPHAGOGASTRODUODENOSCOPY (EGD);  Surgeon: Danie Binder, MD;  Location: [facility];  Service: Endoscopy;  Laterality: N/A;  . RECTOCELE REPAIR  2006  . take down of bladder sling  2006  . tilt table test positive     heart rate "flatlined"  . VAGINAL HYSTERECTOMY  2006   with bladder sling    Family History  Problem Relation Age of Onset  . Heart failure      x3 maternal side  . Heart attack    . Diabetes    . Heart disease      paternal side  . Colon cancer Neg Hx     Social History:  reports that she has never smoked. She has never used smokeless tobacco. She reports that she drinks alcohol. She reports that she does not use drugs.  Allergies:  Allergies  Allergen Reactions  . Codeine Nausea And Vomiting     Prescriptions Prior to Admission  Medication Sig Dispense Refill Last Dose  . ESTERIFIED ESTROGENS PO Take 1 capsule by mouth daily.   Past Week at Unknown time  . ibuprofen (ADVIL,MOTRIN) 200 MG tablet Take 200 mg by mouth every 6 (six) hours as needed for pain.   Past Month at Unknown time  . lisinopril (PRINIVIL,ZESTRIL) 30 MG tablet Take 30 mg by mouth daily.    03/12/2016 at 0900  . VITAMIN E PO Take 1 capsule by mouth daily.   Past Month at Unknown time  . dicyclomine (BENTYL) 10 MG capsule 1-2 PO QAC AND HS. Start with a dose before breakfast & lunch. (Patient not taking: Reported on 01/15/2015) 62 capsule 11   . omeprazole (PRILOSEC) 20 MG capsule 1 po bid FOR 10 DAYS THEN ONE BID FOR 3 mos THEN ONCE DAILY IF YOU ARE USING IBUPROFEN OR ASPIRIN PRODUCTS.. (Patient not taking: Reported on 01/15/2015) 60 capsule 11 Taking  . oxyCODONE-acetaminophen (PERCOCET) 7.5-325 MG tablet Take 1 tablet by mouth every 4 (four) hours as needed for severe pain. (Patient not taking: Reported on 02/26/2016) 15 tablet 0 Not Taking at Unknown time    ROS Neg  Blood pressure (!) 157/88, pulse 77, temperature 98.3 F (36.8 C), temperature source Oral, resp. rate 16, SpO2 99 %. Physical Exam  Colpocele  and high Cystocele.  Shortened vagina.  Assessment/Plan: Colpocele/Cystocele for Robotic Sacrocolpopexy.  Surgery and risks reviewed.  Informed consent signed.  Marie-Lyne Elisabetta Mishra 03/12/2016, 12:29 PM

## 2016-03-12 NOTE — Transfer of Care (Signed)
Immediate Anesthesia Transfer of Care Note  Patient: Jodi Jimenez  Procedure(s) Performed: Procedure(s) with comments: ROBOTIC ASSISTED LAPAROSCOPIC SACROCOLPOPEXY with Carrolyn Meiers (N/A) - Requests 2 1/2 hrs. REQUEST TINA RNFA  Patient Location: PACU  Anesthesia Type:General  Level of Consciousness: awake, alert  and oriented  Airway & Oxygen Therapy: Patient Spontanous Breathing and Patient connected to nasal cannula oxygen  Post-op Assessment: Report given to RN, Post -op Vital signs reviewed and stable and Patient moving all extremities  Post vital signs: Reviewed and stable  Last Vitals:  Vitals:   03/12/16 1148 03/12/16 1200  BP: (!) 161/102 (!) 157/88  Pulse:    Resp:    Temp:      Last Pain:  Vitals:   03/12/16 1135  TempSrc: Oral      Patients Stated Pain Goal: 3 ([id number])  Complications: No apparent anesthesia complications

## 2016-03-12 NOTE — Progress Notes (Addendum)
Assumed care of pt after receiving shift change report. Pt resting comfortably without complaints. POC discussed with pt understanding.   2130: IS initiated. Pt able to achieve up to 1750. Foley emptied. Declines pain med for 4/10 pain. Will cont to monitor. Pt ate 100% of clear diet.  2230: tech states pt sleeping so ambulation will be done sometime A.M.  0150: pt up ambulating and tolerating it well.  0200: new bag LR up and medicated for pain 6-7/10.

## 2016-03-12 NOTE — Anesthesia Postprocedure Evaluation (Signed)
Anesthesia Post Note  Patient: Jodi Jimenez  Procedure(s) Performed: Procedure(s) (LRB): ROBOTIC ASSISTED LAPAROSCOPIC SACROCOLPOPEXY with Carrolyn Meiers (N/A)  Patient location during evaluation: PACU Anesthesia Type: General Level of consciousness: sedated and patient cooperative Pain management: pain level controlled Vital Signs Assessment: post-procedure vital signs reviewed and stable Respiratory status: spontaneous breathing Cardiovascular status: stable Anesthetic complications: no     Last Vitals:  Vitals:   03/12/16 1745 03/12/16 1800  BP: 122/74 128/69  Pulse: 62 (!) 57  Resp: 14 16  Temp: 36.5 C 36.9 C    Last Pain:  Vitals:   03/12/16 1800  TempSrc:   PainSc: 4    Pain Goal: Patients Stated Pain Goal: 3 (03/12/16 1800)               Nolon Nations

## 2016-03-12 NOTE — Op Note (Signed)
03/12/2016  4:20 PM  PATIENT:  Jodi Jimenez  55 y.o. female  PRE-OPERATIVE DIAGNOSIS:  Colpocele Grade 3/3  POST-OPERATIVE DIAGNOSIS:  Colpocele Grade 3/3  PROCEDURE:  Procedure(s): ROBOTIC ASSISTED LAPAROSCOPIC SACROCOLPOPEXY with Carrolyn Meiers  SURGEON:  Surgeon(s): Princess Bruins, MD  ASSISTANTS: Derrell Lolling   ANESTHESIA:   general   PROCEDURE:  Under general anesthesia with endotracheal intubation the patient is in lithotomy position. She is prepped with ChloraPrep on the abdomen and with Betadine on the suprapubic, vulvar and vaginal areas. The patient is draped as usual. The Foley is inserted in the bladder. At the abdomen a supraumbilical incision is made with the scalpel over 1.5 cm after infiltrating Marcaine one quarter plain. The aponeurosis is grasped with Coker clamps and opened with Mayo scissors under direct vision. The parietal peritoneum is opened bluntly with the finger. A pursestring stitch of Vicryl 0 is done on the aponeurosis. The Sheryle Hail is inserted at that level and a pneumoperitoneum is created with CO2. The camera is inserted revealing a free anterior abdominal wall for port insertion. We used a semicircular configuration for ports placement with 2 robotic ports on the right and one robotic port on the lower left. The assistant port an 8 mm port is inserted at the left upper area. All sites are infiltrated with Marcaine one quarter plain. Small incisions are made with the scalpel. All ports are inserted under direct vision. The patient is placed in deep Trendelenburg. The robot his docked from the right side. The instruments are inserted under direct vision with the Endo Shears scissor in the first arm, the PK in the second arm and the fenestrated clamp in the third arm. We then go to the console.       Both ureters are seen with good peristalsis in normal anatomic position. Both ovaries are normal in size and appearance with mild adhesions on the left ovary.  A midline  adhesion is present between the bowel and the bladder.  Pictures are taken of all those structures as well as of the appendix.  We first proceed with lysis of adhesion freeing the left ovary. And then we released the adhesion between the bowels and the ladder.  A large EEA is used vaginally to expose the anterior and posterior vagina.  The visceral peritoneum is opened at the vaginal apex and the bladder is brought down anteriorly.  The bowels are brought down posteriorly.  We half about 4 cm anteriorly and 4 cm posteriorly on the vagina.  The arms of the Y mesh were therefore cut at those lengths.  We then proceeded with exposure of the sacrum by opening the peritoneum at that level and dissecting the fat pad which was quite prominent.  We then proceeded to open the visceral peritoneum from that point to the vagina, creating a tunnel in which the mesh will then be buried.  The anterior ligament of the sacrum was well exposed and visualized on the promontory of the sacrum.  We then switched the instruments to the cutting needle driver in the first arm and the Mega needle driver in the second arm.  Gore-Tex 0 was used in separate stitches to fix the mesh on the vagina with 6 separate stitches anteriorly and 5 separate stitches posteriorly.  We were careful not to go through the vaginal mucosa.  We then measured the length of the tail of the Y mesh to provide good vaginal suspension with correction of the colpo-cystocele.  2 separate stitches of  Gore-Tex 0 were used to fix the tail of the Y mesh to the anterior ligament of the sacrum on the promontory.  The excess mesh was then trimmed.  We used a V lock 2-0 in a running stitch to close the peritoneum above the mesh, burying the mesh.  All needles were removed as we went along through the assistant port.  Hemostasis was adequate at all levels.  A picture was taken.  The robotic instruments were removed.  The robot was undocked.  We went to laparoscopy time.      The  abdominopelvic cavities were irrigated and suctioned.  Hemostasis was confirmed once more.  Bupivacaine was poured in the abdominopelvic cavity. All laparoscopic instruments were removed. The ports were removed under direct vision.  The CO2 was evacuated.  The supraumbilical incision was closed first by attaching the pursestring stitch at the aponeurosis.   All incisions were closed with a subcuticular suture of Vicryl 4-0.  Dermabond was added on all incisions.  A vaginal exam was done confirming excellent support of the vaginal vault and correction of the cystocele.  The patient was brought to recovery room in good and stable status.   ESTIMATED BLOOD LOSS: 50 cc   Intake/Output Summary (Last 24 hours) at 03/12/16 1620 Last data filed at 03/12/16 1612  Gross per 24 hour  Intake             2500 ml  Output              110 ml  Net             2390 ml     BLOOD ADMINISTERED:none   LOCAL MEDICATIONS USED:  MARCAINE    and BUPIVICAINE   SPECIMEN:  None  DISPOSITION OF SPECIMEN:  N/A  COUNTS:  YES  PLAN OF CARE: Transfer to PACU  Princess Bruins MD  03/12/2016 at 4:21 pm

## 2016-03-12 NOTE — Anesthesia Preprocedure Evaluation (Signed)
Anesthesia Evaluation  Patient identified by MRN, date of birth, ID band Patient awake    Reviewed: Allergy & Precautions, NPO status , Patient's Chart, lab work & pertinent test results  History of Anesthesia Complications (+) PONV and history of anesthetic complications  Airway Mallampati: III  TM Distance: >3 FB Neck ROM: Full    Dental no notable dental hx. (+) Dental Advisory Given   Pulmonary neg pulmonary ROS,    Pulmonary exam normal breath sounds clear to auscultation       Cardiovascular hypertension, Pt. on medications + Peripheral Vascular Disease  Normal cardiovascular exam Rhythm:Regular Rate:Normal     Neuro/Psych negative neurological ROS  negative psych ROS   GI/Hepatic Neg liver ROS, GERD  ,  Endo/Other  negative endocrine ROS  Renal/GU negative Renal ROS     Musculoskeletal  (+) Arthritis ,   Abdominal (+) + obese,   Peds  Hematology negative hematology ROS (+)   Anesthesia Other Findings   Reproductive/Obstetrics negative OB ROS                             Anesthesia Physical  Anesthesia Plan  ASA: II  Anesthesia Plan: General   Post-op Pain Management:    Induction: Intravenous  Airway Management Planned: Oral ETT  Additional Equipment:   Intra-op Plan:   Post-operative Plan: Extubation in OR  Informed Consent: I have reviewed the patients History and Physical, chart, labs and discussed the procedure including the risks, benefits and alternatives for the proposed anesthesia with the patient or authorized representative who has indicated his/her understanding and acceptance.   Dental advisory given  Plan Discussed with: CRNA  Anesthesia Plan Comments:         Anesthesia Quick Evaluation

## 2016-03-13 DIAGNOSIS — N815 Vaginal enterocele: Secondary | ICD-10-CM | POA: Diagnosis not present

## 2016-03-13 LAB — CBC
HCT: 34.9 % — ABNORMAL LOW (ref 36.0–46.0)
HEMOGLOBIN: 11.8 g/dL — AB (ref 12.0–15.0)
MCH: 31.4 pg (ref 26.0–34.0)
MCHC: 33.8 g/dL (ref 30.0–36.0)
MCV: 92.8 fL (ref 78.0–100.0)
Platelets: 247 10*3/uL (ref 150–400)
RBC: 3.76 MIL/uL — AB (ref 3.87–5.11)
RDW: 13.7 % (ref 11.5–15.5)
WBC: 10.7 10*3/uL — ABNORMAL HIGH (ref 4.0–10.5)

## 2016-03-13 MED ORDER — OXYCODONE-ACETAMINOPHEN 7.5-325 MG PO TABS: 1.0000 | ORAL_TABLET | ORAL | 0 refills | Status: DC | PRN

## 2016-03-13 NOTE — Progress Notes (Signed)
Patient given discharge information and verbalizes understanding.

## 2016-03-13 NOTE — Progress Notes (Signed)
1 Day Post-Op Procedure(s) (LRB): ROBOTIC ASSISTED LAPAROSCOPIC SACROCOLPOPEXY with Y-Mesh (N/A)  Subjective: Patient reports that pain is well managed.  Tolerating normal diet as tolerated  diet without difficulty. No nausea / vomiting.  Ambulating and voiding.  Objective: BP (!) 114/58 (BP Location: Right Arm)   Pulse 63   Temp 98.6 F (37 C) (Oral)   Resp 16   Ht 5\' 3"  (1.6 m)   Wt 187 lb (84.8 kg)   SpO2 97%   BMI 33.13 kg/m  Lungs: clear Heart: normal rate and rhythm Abdomen:soft and appropriately tender Extremities: Homans sign is negative, no sign of DVT Incision: healing well  Post op Hb 11.8  Assessment: s/p Procedure(s): ROBOTIC ASSISTED LAPAROSCOPIC SACROCOLPOPEXY with Y-Mesh: progressing well  Plan: Discharge home  LOS: 0 days    Marie-Lyne Derris Millan 03/13/2016, 12:22 PM

## 2016-03-13 NOTE — Discharge Instructions (Signed)
Sacrocolpopexy, Care After Refer to this sheet in the next few weeks. These instructions provide you with information on caring for yourself after your procedure. Your health care provider may also give you more specific instructions. Your treatment has been planned according to current medical practices, but problems sometimes occur. Call your health care provider if you have any problems or questions after your procedure.  WHAT TO EXPECT AFTER THE PROCEDURE  After your procedure, it is typical to have the following:  Pain.  Some vaginal bleeding.  Fatigue. HOME CARE INSTRUCTIONS  Medicine   Only take medicines as directed by your health care provider.  Make sure to finish antibiotic medicine even if you start to feel better. Bandages   Care for your bandages and cuts (incisions) as directed by your surgeon.  Do not shower until after your bandages have been removed. Activity   Take at least two 10-minute walks each day.  Ask your health care provider when you can return to work and do all your usual activities. It may take 3 months to recover completely.  You will have to restrict many activities when you first return home. For at least 6 weeks:  Do not lift anything heavier than a gallon of milk.  Do not sit on a bike seat.  Do not use a tampon or put anything inside your vagina.  Do not have sexual intercourse.  Do not participate instrenuous activities like running or aerobics. Other Instructions   Do not take a bath or go swimming until your health care provider says you can. Most people can shower after about 6 weeks after the procedure.  Drink enough fluids to keep your urine clear or pale yellow. This helps prevent constipation.  See your health care provider to have your stitches or staples removed if necessary.  Keep all follow-up appointments. SEEK MEDICAL CARE IF:   You have chills or fever.  Your pain medicine is not helping.  You have vaginal  bleeding or discharge that will not go away. SEEK IMMEDIATE MEDICAL CARE IF:   You have a fever for more than 2-3 days.  You have very bad pain.  You have heavy vaginal bleeding or discharge.  You have any signs of infection around your cut (incision). Watch for:  Redness.  Tenderness.  Warmth.  Pus or other discharge.  You develop a warm, tender area in your leg.  You have chest pain or trouble breathing. This information is not intended to replace advice given to you by your health care provider. Make sure you discuss any questions you have with your health care provider. Document Released: 04/18/2013 Document Reviewed: 04/18/2013 Elsevier Interactive Patient Education  2017 [employer].

## 2016-03-13 NOTE — Addendum Note (Signed)
Addendum  created 03/13/16 0817 by Garner Nash, CRNA   Sign clinical note

## 2016-03-13 NOTE — Anesthesia Postprocedure Evaluation (Signed)
Anesthesia Post Note  Patient: Jodi Jimenez  Procedure(s) Performed: Procedure(s) (LRB): ROBOTIC ASSISTED LAPAROSCOPIC SACROCOLPOPEXY with Carrolyn Meiers (N/A)  Patient location during evaluation: [facility] Anesthesia Type: General Level of consciousness: awake and alert Pain management: pain level controlled Vital Signs Assessment: post-procedure vital signs reviewed and stable Respiratory status: spontaneous breathing Cardiovascular status: stable Postop Assessment: no headache and no signs of nausea or vomiting Anesthetic complications: no Comments: Pain score 2.     Last Vitals:  Vitals:   03/13/16 0137 03/13/16 0516  BP: (!) 98/55 (!) 107/49  Pulse: 72 65  Resp: 18 16  Temp: 36.8 C 36.5 C    Last Pain:  Vitals:   03/13/16 0516  TempSrc: Oral  PainSc:    Pain Goal: Patients Stated Pain Goal: 3 (03/13/16 0200)               Rico Sheehan

## 2016-03-13 NOTE — Discharge Summary (Signed)
Physician Discharge Summary  Patient ID: Jodi Jimenez MRN: [id number] DOB/AGE: 05-06-1960 55 y.o.  Admit date: 03/12/2016 Discharge date: 03/13/2016  Admission Diagnoses: Colpocele Grade 3 3   Discharge Diagnoses: Colpocele Grade 3 3         Active Problems:   Postoperative state   Discharged Condition: good  Hospital Course:   Consults: None  Treatments: surgery: Robotic Sacrocolpopexy, Lysis of Adhesions  Disposition: 01-Home or Self Care    Follow-up Information    Princess Bruins, MD Follow up in 3 week(s).   Specialty:  Obstetrics and Gynecology Contact information: [place] [address] [phone]           Signed: Princess Bruins, MD 03/13/2016, 12:27 PM

## 2016-03-15 ENCOUNTER — Encounter (HOSPITAL_COMMUNITY): Payer: Self-pay | Admitting: Obstetrics & Gynecology

## 2016-12-17 ENCOUNTER — Encounter: Payer: Self-pay | Admitting: General Surgery

## 2016-12-17 ENCOUNTER — Ambulatory Visit (INDEPENDENT_AMBULATORY_CARE_PROVIDER_SITE_OTHER): Payer: BLUE CROSS/BLUE SHIELD | Admitting: General Surgery

## 2016-12-17 VITALS — BP 151/79 | HR 73 | Temp 98.5°F | Resp 18 | Ht 64.0 in | Wt 194.0 lb

## 2016-12-17 DIAGNOSIS — K432 Incisional hernia without obstruction or gangrene: Secondary | ICD-10-CM | POA: Diagnosis not present

## 2016-12-17 NOTE — Patient Instructions (Signed)
 Ventral Hernia A ventral hernia is a bulge of tissue from inside the abdomen that pushes through a weak area of the muscles that form the front wall of the abdomen. The tissues inside the abdomen are inside a sac (peritoneum). These tissues include the small intestine, large intestine, and the fatty tissue that covers the intestines (omentum). Sometimes, the bulge that forms a hernia contains intestines. Other hernias contain only fat. Ventral hernias do not go away without surgical treatment. There are several types of ventral hernias. You may have:  A hernia at an incision site from previous abdominal surgery (incisional hernia).  A hernia just above the belly button (epigastric hernia), or at the belly button (umbilical hernia). These types of hernias can develop from heavy lifting or straining.  A hernia that comes and goes (reducible hernia). It may be visible only when you lift or strain. This type of hernia can be pushed back into the abdomen (reduced).  A hernia that traps abdominal tissue inside the hernia (incarcerated hernia). This type of hernia does not reduce.  A hernia that cuts off blood flow to the tissues inside the hernia (strangulated hernia). The tissues can start to die if this happens. This is a very painful bulge that cannot be reduced. A strangulated hernia is a medical emergency.  What are the causes? This condition is caused by abdominal tissue putting pressure on an area of weakness in the abdominal muscles. What increases the risk? The following factors may make you more likely to develop this condition:  Being female.  Being 60 or older.  Being overweight or obese.  Having had previous abdominal surgery, especially if there was an infection after surgery.  Having had an injury to the abdominal wall.  Having had several pregnancies.  Having a buildup of fluid inside the abdomen (ascites).  What are the signs or symptoms? The only symptom of a ventral  hernia may be a painless bulge in the abdomen. A reducible hernia may be visible only when you strain, cough, or lift. Other symptoms may include:  Dull pain.  A feeling of pressure.  Signs and symptoms of a strangulated hernia may include:  Increasing pain.  Nausea and vomiting.  Pain when pressing on the hernia.  The skin over the hernia turning red or purple.  Constipation.  Blood in the stool (feces).  How is this diagnosed? This condition may be diagnosed based on:  Your symptoms.  Your medical history.  A physical exam. You may be asked to cough or strain while standing. These actions increase the pressure inside your abdomen and force the hernia through the opening in your muscles. Your health care provider may try to reduce the hernia by pressing on it.  Imaging studies, such as an ultrasound or CT scan.  How is this treated? This condition is treated with surgery. If you have a strangulated hernia, surgery is done as soon as possible. If your hernia is small and not incarcerated, you may be asked to lose some weight before surgery. Follow these instructions at home:  Follow instructions from your health care provider about eating or drinking restrictions.  If you are overweight, your health care provider may recommend that you increase your activity level and eat a healthier diet.  Do not lift anything that is heavier than 10 lb (4.5 kg).  Return to your normal activities as told by your health care provider. Ask your health care provider what activities are safe for you. You   may need to avoid activities that increase pressure on your hernia.  Take over-the-counter and prescription medicines only as told by your health care provider.  Keep all follow-up visits as told by your health care provider. This is important. Contact a health care provider if:  Your hernia gets larger.  Your hernia becomes painful. Get help right away if:  Your hernia becomes  increasingly painful.  You have pain along with any of the following: ? Changes in skin color in the area of the hernia. ? Nausea. ? Vomiting. ? Fever. Summary  A ventral hernia is a bulge of tissue from inside the abdomen that pushes through a weak area of the muscles that form the front wall of the abdomen.  This condition is treated with surgery, which may be urgent depending on your hernia.  Do not lift anything that is heavier than 10 lb (4.5 kg), and follow activity instructions from your health care provider. This information is not intended to replace advice given to you by your health care provider. Make sure you discuss any questions you have with your health care provider. Document Released: 03/30/2012 Document Revised: 11/29/2015 Document Reviewed: 11/29/2015 Elsevier Interactive Patient Education  2018 [employer]  

## 2016-12-17 NOTE — Progress Notes (Signed)
Jodi Jimenez; [id number]; July 14, 1960   HPI   Patient is a 56 year old white female who was referred to my care by Dr. Nevada Crane for evaluation treatment of a ventral hernia.  His present for many months.  Only occasional discomfort is noted when it is sticking out.  She denies any nausea or vomiting.  When sticks out, her pain is 5/10. It is made worse with straining or coughing. Past Medical History:  Diagnosis Date  . GERD (gastroesophageal reflux disease)    resolved   . Helicobacter pylori gastritis 12/19/2012   AUG 2014 EGD-ABO BID FOR 10 DAYS   . HTN (hypertension)   . PONV (postoperative nausea and vomiting)   . Red birthmarks    left hand and forearm  . Syncope and collapse    Dr. Jens Som, tilt table test+    Past Surgical History:  Procedure Laterality Date  . CHOLECYSTECTOMY    . COLONOSCOPY N/A 12/13/2012   Procedure: COLONOSCOPY;  Surgeon: Danie Binder, MD;  Location: [facility];  Service: Endoscopy;  Laterality: N/A;  10:45  . CYSTOCELE REPAIR N/A 01/23/2015   Procedure: ANTERIOR REPAIR (CYSTOCELE);  Surgeon: Princess Bruins, MD;  Location: [facility];  Service: Gynecology;  Laterality: N/A;  . ELBOW ARTHROSCOPY Right    repair torn ligament  . ESOPHAGOGASTRODUODENOSCOPY N/A 12/13/2012   Procedure: ESOPHAGOGASTRODUODENOSCOPY (EGD);  Surgeon: Danie Binder, MD;  Location: [facility];  Service: Endoscopy;  Laterality: N/A;  . RECTOCELE REPAIR  2006  . ROBOTIC ASSISTED LAPAROSCOPIC SACROCOLPOPEXY N/A 03/12/2016   Procedure: ROBOTIC ASSISTED LAPAROSCOPIC SACROCOLPOPEXY with Carrolyn Meiers;  Surgeon: Princess Bruins, MD;  Location: [facility];  Service: Gynecology;  Laterality: N/A;  Requests 2 1/2 hrs. REQUEST TINA RNFA  . take down of bladder sling  2006  . tilt table test positive     heart rate "flatlined"  . VAGINAL HYSTERECTOMY  2006   with bladder sling    Family History  Problem Relation Age of Onset  . Heart failure Unknown        x3 maternal side  . Heart attack  Unknown   . Diabetes Unknown   . Heart disease Unknown        paternal side  . Colon cancer Neg Hx     Current Outpatient Prescriptions on File Prior to Visit  Medication Sig Dispense Refill  . dicyclomine (BENTYL) 10 MG capsule 1-2 PO QAC AND HS. Start with a dose before breakfast & lunch. (Patient not taking: Reported on 01/15/2015) 62 capsule 11  . ESTERIFIED ESTROGENS PO Take 1 capsule by mouth daily.    Marland Kitchen ibuprofen (ADVIL,MOTRIN) 200 MG tablet Take 200 mg by mouth every 6 (six) hours as needed for pain.    Marland Kitchen lisinopril (PRINIVIL,ZESTRIL) 30 MG tablet Take 30 mg by mouth daily.     Marland Kitchen omeprazole (PRILOSEC) 20 MG capsule 1 po bid FOR 10 DAYS THEN ONE BID FOR 3 mos THEN ONCE DAILY IF YOU ARE USING IBUPROFEN OR ASPIRIN PRODUCTS.. (Patient not taking: Reported on 01/15/2015) 60 capsule 11  . oxyCODONE-acetaminophen (PERCOCET) 7.5-325 MG tablet Take 1 tablet by mouth every 4 (four) hours as needed for severe pain. (Patient not taking: Reported on 02/26/2016) 15 tablet 0  . oxyCODONE-acetaminophen (PERCOCET) 7.5-325 MG tablet Take 1 tablet by mouth every 4 (four) hours as needed for severe pain. 20 tablet 0  . VITAMIN E PO Take 1 capsule by mouth daily.     No current facility-administered medications on file prior to  visit.     Allergies  Allergen Reactions  . Codeine Nausea And Vomiting    History  Alcohol Use  . Yes    Comment: occasionally, very rare    History  Smoking Status  . Never Smoker  Smokeless Tobacco  . Never Used    Review of Systems  Constitutional: Negative.   HENT: Negative.   Eyes: Positive for blurred vision.  Respiratory: Negative.   Cardiovascular: Negative.   Gastrointestinal: Positive for abdominal pain, heartburn and nausea.       Patient states this is only occasionally.  Genitourinary: Negative.   Musculoskeletal: Positive for back pain and neck pain.  Skin: Negative.   Neurological: Negative.   Endo/Heme/Allergies: Negative.    Psychiatric/Behavioral: Negative.     Objective   Vitals:   12/17/16 1035  BP: (!) 151/79  Pulse: 73  Resp: 18  Temp: 98.5 F (36.9 C)    Physical Exam  Constitutional: She is oriented to person, place, and time and well-developed, well-nourished, and in no distress.  HENT:  Head: Normocephalic and atraumatic.  Cardiovascular: Normal rate, regular rhythm and normal heart sounds.  Exam reveals no gallop.   No murmur heard. Pulmonary/Chest: Effort normal and breath sounds normal. No respiratory distress. She has no wheezes. She has no rales.  Abdominal: Soft. Bowel sounds are normal. She exhibits no distension. There is no tenderness. There is no rebound.  A large reducible incisional hernia is noted just superior and left lateral to the umbilicus.  Neurological: She is alert and oriented to person, place, and time.  Skin: Skin is warm and dry.  Vitals reviewed.   Assessment  Incisional hernia Plan   Patient has not had any episode of incarceration. Given its size, her risk of incarceration is low. Patient does not want surgery unless absolutely necessary. I am fine with that. Literature was given about incarceration. She will return should any problems arise.

## 2017-05-19 ENCOUNTER — Encounter: Payer: Self-pay | Admitting: Obstetrics & Gynecology

## 2017-05-27 ENCOUNTER — Encounter: Payer: Self-pay | Admitting: Obstetrics & Gynecology

## 2017-06-04 ENCOUNTER — Telehealth: Payer: Self-pay | Admitting: *Deleted

## 2017-06-04 MED ORDER — ESTRADIOL 0.5 MG PO TABS: 0.5000 mg | ORAL_TABLET | Freq: Every day | ORAL | 0 refills | Status: DC

## 2017-06-04 NOTE — Telephone Encounter (Signed)
Pt called requesting refill estrogen 0.5 mg tablet 1 po daily, [facility] chart has arrived, last annual exam in 01/28/2016. I told pt she needs annual exam, at work now states she will call to schedule once she gets off work. #30 sent only for Rx.

## 2017-07-07 ENCOUNTER — Other Ambulatory Visit: Payer: Self-pay | Admitting: Obstetrics & Gynecology

## 2017-07-07 NOTE — Telephone Encounter (Signed)
CE is scheduled 07/30/17 with Dr. Dellis Filbert.

## 2017-07-30 ENCOUNTER — Ambulatory Visit (INDEPENDENT_AMBULATORY_CARE_PROVIDER_SITE_OTHER): Payer: BLUE CROSS/BLUE SHIELD | Admitting: Obstetrics & Gynecology

## 2017-07-30 ENCOUNTER — Encounter: Payer: Self-pay | Admitting: Obstetrics & Gynecology

## 2017-07-30 VITALS — BP 132/80 | Ht 63.0 in | Wt 193.0 lb

## 2017-07-30 DIAGNOSIS — Z01411 Encounter for gynecological examination (general) (routine) with abnormal findings: Secondary | ICD-10-CM | POA: Diagnosis not present

## 2017-07-30 DIAGNOSIS — Z90710 Acquired absence of both cervix and uterus: Secondary | ICD-10-CM

## 2017-07-30 DIAGNOSIS — Z13820 Encounter for screening for osteoporosis: Secondary | ICD-10-CM | POA: Diagnosis not present

## 2017-07-30 DIAGNOSIS — Z79890 Hormone replacement therapy (postmenopausal): Secondary | ICD-10-CM | POA: Diagnosis not present

## 2017-07-30 DIAGNOSIS — Z9889 Other specified postprocedural states: Secondary | ICD-10-CM

## 2017-07-30 MED ORDER — ESTRADIOL 0.5 MG PO TABS: 0.5000 mg | ORAL_TABLET | Freq: Every day | ORAL | 4 refills | Status: DC

## 2017-07-30 NOTE — Patient Instructions (Addendum)
1. Encounter for gynecological examination with abnormal finding Gynecologic exam status post vaginal hysterectomy, anterior-posterior repair, sling procedure and finally robotic sacrocolpopexy.  Pap test negative with negative high risk HPV August 2016.  Will repeat Pap at 5 years.  Breast exam normal.  Last screening mammogram 04/2017 incomplete, f/u Rt Dx mammo/Breast US: Probably benign.  Will f/u 6 months.  Health labs with family physician.  Colonoscopy 2014.  2. S/P total vaginal hysterectomy  3. History of sacrocolpopexy Good support of the vagina with no recurrence of cystocele, colpocele or rectocele post robotic sacrocolpopexy.  4. Post-menopause on HRT (hormone replacement therapy) Well on estradiol tablets 0.5 mg daily.  Started hormone replacement therapy in December 2017.  No contraindication to hormone replacement therapy.  Status post total hysterectomy.  Estradiol 0.5 mg daily represcribed.  5. Screening for osteoporosis Vitamin D supplements, calcium rich nutrition and regular weightbearing physical activity recommended.  Will follow up here for bone density. - DG Bone Density; Future  Other orders - estradiol (ESTRACE) 0.5 MG tablet; Take 1 tablet (0.5 mg total) by mouth daily.  Jodi Jimenez, it was a pleasure seeing you today!  I will inform you of your bone density results when available.   Health Maintenance for Postmenopausal Women Menopause is a normal process in which your reproductive ability comes to an end. This process happens gradually over a span of months to years, usually between the ages of 58 and 44. Menopause is complete when you have missed 12 consecutive menstrual periods. It is important to talk with your health care provider about some of the most common conditions that affect postmenopausal women, such as heart disease, cancer, and bone loss (osteoporosis). Adopting a healthy lifestyle and getting preventive care can help to promote your health and  wellness. Those actions can also lower your chances of developing some of these common conditions. What should I know about menopause? During menopause, you may experience a number of symptoms, such as:  Moderate-to-severe hot flashes.  Night sweats.  Decrease in sex drive.  Mood swings.  Headaches.  Tiredness.  Irritability.  Memory problems.  Insomnia.  Choosing to treat or not to treat menopausal changes is an individual decision that you make with your health care provider. What should I know about hormone replacement therapy and supplements? Hormone therapy products are effective for treating symptoms that are associated with menopause, such as hot flashes and night sweats. Hormone replacement carries certain risks, especially as you become older. If you are thinking about using estrogen or estrogen with progestin treatments, discuss the benefits and risks with your health care provider. What should I know about heart disease and stroke? Heart disease, heart attack, and stroke become more likely as you age. This may be due, in part, to the hormonal changes that your body experiences during menopause. These can affect how your body processes dietary fats, triglycerides, and cholesterol. Heart attack and stroke are both medical emergencies. There are many things that you can do to help prevent heart disease and stroke:  Have your blood pressure checked at least every 1-2 years. High blood pressure causes heart disease and increases the risk of stroke.  If you are 41-53 years old, ask your health care provider if you should take aspirin to prevent a heart attack or a stroke.  Do not use any tobacco products, including cigarettes, chewing tobacco, or electronic cigarettes. If you need help quitting, ask your health care provider.  It is important to eat a healthy diet  and maintain a healthy weight. ? Be sure to include plenty of vegetables, fruits, low-fat dairy products, and  lean protein. ? Avoid eating foods that are high in solid fats, added sugars, or salt (sodium).  Get regular exercise. This is one of the most important things that you can do for your health. ? Try to exercise for at least 150 minutes each week. The type of exercise that you do should increase your heart rate and make you sweat. This is known as moderate-intensity exercise. ? Try to do strengthening exercises at least twice each week. Do these in addition to the moderate-intensity exercise.  Know your numbers.Ask your health care provider to check your cholesterol and your blood glucose. Continue to have your blood tested as directed by your health care provider.  What should I know about cancer screening? There are several types of cancer. Take the following steps to reduce your risk and to catch any cancer development as early as possible. Breast Cancer  Practice breast self-awareness. ? This means understanding how your breasts normally appear and feel. ? It also means doing regular breast self-exams. Let your health care provider know about any changes, no matter how small.  If you are 13 or older, have a clinician do a breast exam (clinical breast exam or CBE) every year. Depending on your age, family history, and medical history, it may be recommended that you also have a yearly breast X-ray (mammogram).  If you have a family history of breast cancer, talk with your health care provider about genetic screening.  If you are at high risk for breast cancer, talk with your health care provider about having an MRI and a mammogram every year.  Breast cancer (BRCA) gene test is recommended for women who have family members with BRCA-related cancers. Results of the assessment will determine the need for genetic counseling and BRCA1 and for BRCA2 testing. BRCA-related cancers include these types: ? Breast. This occurs in males or females. ? Ovarian. ? Tubal. This may also be called fallopian  tube cancer. ? Cancer of the abdominal or pelvic lining (peritoneal cancer). ? Prostate. ? Pancreatic.  Cervical, Uterine, and Ovarian Cancer Your health care provider may recommend that you be screened regularly for cancer of the pelvic organs. These include your ovaries, uterus, and vagina. This screening involves a pelvic exam, which includes checking for microscopic changes to the surface of your cervix (Pap test).  For women ages 21-65, health care providers may recommend a pelvic exam and a Pap test every three years. For women ages 64-65, they may recommend the Pap test and pelvic exam, combined with testing for human papilloma virus (HPV), every five years. Some types of HPV increase your risk of cervical cancer. Testing for HPV may also be done on women of any age who have unclear Pap test results.  Other health care providers may not recommend any screening for nonpregnant women who are considered low risk for pelvic cancer and have no symptoms. Ask your health care provider if a screening pelvic exam is right for you.  If you have had past treatment for cervical cancer or a condition that could lead to cancer, you need Pap tests and screening for cancer for at least 20 years after your treatment. If Pap tests have been discontinued for you, your risk factors (such as having a new sexual partner) need to be reassessed to determine if you should start having screenings again. Some women have medical problems that increase  the chance of getting cervical cancer. In these cases, your health care provider may recommend that you have screening and Pap tests more often.  If you have a family history of uterine cancer or ovarian cancer, talk with your health care provider about genetic screening.  If you have vaginal bleeding after reaching menopause, tell your health care provider.  There are currently no reliable tests available to screen for ovarian cancer.  Lung Cancer Lung cancer  screening is recommended for adults 70-85 years old who are at high risk for lung cancer because of a history of smoking. A yearly low-dose CT scan of the lungs is recommended if you:  Currently smoke.  Have a history of at least 30 pack-years of smoking and you currently smoke or have quit within the past 15 years. A pack-year is smoking an average of one pack of cigarettes per day for one year.  Yearly screening should:  Continue until it has been 15 years since you quit.  Stop if you develop a health problem that would prevent you from having lung cancer treatment.  Colorectal Cancer  This type of cancer can be detected and can often be prevented.  Routine colorectal cancer screening usually begins at age 15 and continues through age 36.  If you have risk factors for colon cancer, your health care provider may recommend that you be screened at an earlier age.  If you have a family history of colorectal cancer, talk with your health care provider about genetic screening.  Your health care provider may also recommend using home test kits to check for hidden blood in your stool.  A small camera at the end of a tube can be used to examine your colon directly (sigmoidoscopy or colonoscopy). This is done to check for the earliest forms of colorectal cancer.  Direct examination of the colon should be repeated every 5-10 years until age 61. However, if early forms of precancerous polyps or small growths are found or if you have a family history or genetic risk for colorectal cancer, you may need to be screened more often.  Skin Cancer  Check your skin from head to toe regularly.  Monitor any moles. Be sure to tell your health care provider: ? About any new moles or changes in moles, especially if there is a change in a mole's shape or color. ? If you have a mole that is larger than the size of a pencil eraser.  If any of your family members has a history of skin cancer, especially at a  young age, talk with your health care provider about genetic screening.  Always use sunscreen. Apply sunscreen liberally and repeatedly throughout the day.  Whenever you are outside, protect yourself by wearing long sleeves, pants, a wide-brimmed hat, and sunglasses.  What should I know about osteoporosis? Osteoporosis is a condition in which bone destruction happens more quickly than new bone creation. After menopause, you may be at an increased risk for osteoporosis. To help prevent osteoporosis or the bone fractures that can happen because of osteoporosis, the following is recommended:  If you are 88-77 years old, get at least 1,000 mg of calcium and at least 600 mg of vitamin D per day.  If you are older than age 74 but younger than age 22, get at least 1,200 mg of calcium and at least 600 mg of vitamin D per day.  If you are older than age 61, get at least 1,200 mg of calcium and  at least 800 mg of vitamin D per day.  Smoking and excessive alcohol intake increase the risk of osteoporosis. Eat foods that are rich in calcium and vitamin D, and do weight-bearing exercises several times each week as directed by your health care provider. What should I know about how menopause affects my mental health? Depression may occur at any age, but it is more common as you become older. Common symptoms of depression include:  Low or sad mood.  Changes in sleep patterns.  Changes in appetite or eating patterns.  Feeling an overall lack of motivation or enjoyment of activities that you previously enjoyed.  Frequent crying spells.  Talk with your health care provider if you think that you are experiencing depression. What should I know about immunizations? It is important that you get and maintain your immunizations. These include:  Tetanus, diphtheria, and pertussis (Tdap) booster vaccine.  Influenza every year before the flu season begins.  Pneumonia vaccine.  Shingles vaccine.  Your  health care provider may also recommend other immunizations. This information is not intended to replace advice given to you by your health care provider. Make sure you discuss any questions you have with your health care provider. Document Released: 06/05/2005 Document Revised: 11/01/2015 Document Reviewed: 01/15/2015 Elsevier Interactive Patient Education  2018 [employer].

## 2017-07-30 NOTE — Progress Notes (Addendum)
Jodi Jimenez [birth year] [id number]   History:    57 y.o. G3P3L3 Married  RP:  Established patient presenting for annual gyn exam   HPI: Menopausal, well on estradiol tablets 0.5 mg daily.  Started on hormone replacement therapy December 2017.  At that time her [facility] confirmed menopause at 64.1.  Status post vaginal hysterectomy with a sling procedure and anterior posterior repair in 2008.  The Sling was a little too tight and the stitch was cut.  Then, patient had ananterior repair again in 2016.  Given a recurrence of descent with a colpocele and a high cystocele, the decision was made to proceed with a robotic sacrocolpopexy, which was performed on March 12, 2016.  Since then, the patient has done very well with no feeling of descent and no urinary incontinence.  Patient is rarely sexually active because her husband has had erectile problems recently due to medications.  No pelvic pain.  Normal vaginal secretions.  Bowel movements normal.  Breasts normal.  Body mass index 34.19.  Physically active.  Health labs with family physician.  Past medical history,surgical history, family history and social history were all reviewed and documented in the EPIC chart.  Gynecologic History No LMP recorded. Patient has had a hysterectomy. Contraception: status post hysterectomy Last Pap: 11/2014. Results were: Negative/HPV HR neg Last mammogram: 04/2017. Results were: Incomplete.  Went back for a Rt Dx mammo/Breast US:  Probably benign.  Repeat at 6 month. Bone Density: Will schedule here now Colonoscopy: 2014  Obstetric History OB History  Gravida Para Term Preterm AB Living  3 3       3   SAB TAB Ectopic Multiple Live Births               # Outcome Date GA Lbr Len/2nd Weight Sex Delivery Anes PTL Lv  3 Para           2 Para           1 Para              ROS: A ROS was performed and pertinent positives and negatives are included in the history.  GENERAL: No fevers or chills. HEENT: No  change in vision, no earache, sore throat or sinus congestion. NECK: No pain or stiffness. CARDIOVASCULAR: No chest pain or pressure. No palpitations. PULMONARY: No shortness of breath, cough or wheeze. GASTROINTESTINAL: No abdominal pain, nausea, vomiting or diarrhea, melena or bright red blood per rectum. GENITOURINARY: No urinary frequency, urgency, hesitancy or dysuria. MUSCULOSKELETAL: No joint or muscle pain, no back pain, no recent trauma. DERMATOLOGIC: No rash, no itching, no lesions. ENDOCRINE: No polyuria, polydipsia, no heat or cold intolerance. No recent change in weight. HEMATOLOGICAL: No anemia or easy bruising or bleeding. NEUROLOGIC: No headache, seizures, numbness, tingling or weakness. PSYCHIATRIC: No depression, no loss of interest in normal activity or change in sleep pattern.     Exam:   BP 132/80   Ht 5\' 3"  (1.6 m)   Wt 193 lb (87.5 kg)   BMI 34.19 kg/m   Body mass index is 34.19 kg/m.  General appearance : Well developed well nourished female. No acute distress HEENT: Eyes: no retinal hemorrhage or exudates,  Neck supple, trachea midline, no carotid bruits, no thyroidmegaly Lungs: Clear to auscultation, no rhonchi or wheezes, or rib retractions  Heart: Regular rate and rhythm, no murmurs or gallops Breast:Examined in sitting and supine position were symmetrical in appearance, no palpable masses or tenderness,  no  skin retraction, no nipple inversion, no nipple discharge, no skin discoloration, no axillary or supraclavicular lymphadenopathy Abdomen: no palpable masses or tenderness, no rebound or guarding.  Left paraumbilical hernia.  Hernia is reducible. Extremities: no edema or skin discoloration or tenderness  Pelvic: Vulva: Normal             Vagina: No gross lesions or discharge.  Good suspension post Sacrocolpopexy.  Cervix/Uterus absent  Adnexa  Without masses or tenderness  Anus: Normal   Assessment/Plan:  57 y.o. female for annual exam   1. Encounter for  gynecological examination with abnormal finding Gynecologic exam status post vaginal hysterectomy, anterior-posterior repair, sling procedure and finally robotic sacrocolpopexy.  Pap test negative with negative high risk HPV August 2016.  Will repeat Pap at 5 years.  Breast exam normal.  Last screening mammogram 04/2017 incomplete, f/u Rt Dx mammo/Breast US: Probably benign.  Will f/u 6 months.  Health labs with family physician.  Colonoscopy 2014.  2. S/P total vaginal hysterectomy  3. History of sacrocolpopexy Good support of the vagina with no recurrence of cystocele, colpocele or rectocele post robotic sacrocolpopexy.  4. Post-menopause on HRT (hormone replacement therapy) Well on estradiol tablets 0.5 mg daily.  Started hormone replacement therapy in December 2017.  No contraindication to hormone replacement therapy.  Status post total hysterectomy.  Estradiol 0.5 mg daily represcribed.  5. Screening for osteoporosis Vitamin D supplements, calcium rich nutrition and regular weightbearing physical activity recommended.  Will follow up here for bone density. - DG Bone Density; Future  Other orders - estradiol (ESTRACE) 0.5 MG tablet; Take 1 tablet (0.5 mg total) by mouth daily.  Princess Bruins MD, 2:12 PM 07/30/2017

## 2017-08-16 ENCOUNTER — Other Ambulatory Visit: Payer: Self-pay | Admitting: Gynecology

## 2017-08-16 DIAGNOSIS — Z13820 Encounter for screening for osteoporosis: Secondary | ICD-10-CM

## 2017-08-24 ENCOUNTER — Ambulatory Visit: Payer: BLUE CROSS/BLUE SHIELD

## 2017-10-26 ENCOUNTER — Encounter: Payer: Self-pay | Admitting: Gastroenterology

## 2017-11-30 ENCOUNTER — Encounter: Payer: Self-pay | Admitting: Anesthesiology

## 2017-12-06 ENCOUNTER — Encounter: Payer: Self-pay | Admitting: Obstetrics & Gynecology

## 2017-12-06 ENCOUNTER — Other Ambulatory Visit: Payer: Self-pay | Admitting: Radiology

## 2017-12-17 ENCOUNTER — Encounter (HOSPITAL_BASED_OUTPATIENT_CLINIC_OR_DEPARTMENT_OTHER): Payer: Self-pay | Admitting: *Deleted

## 2017-12-17 ENCOUNTER — Other Ambulatory Visit: Payer: Self-pay

## 2017-12-19 ENCOUNTER — Ambulatory Visit: Payer: Self-pay | Admitting: Surgery

## 2017-12-19 DIAGNOSIS — N6310 Unspecified lump in the right breast, unspecified quadrant: Secondary | ICD-10-CM

## 2017-12-20 ENCOUNTER — Encounter (HOSPITAL_BASED_OUTPATIENT_CLINIC_OR_DEPARTMENT_OTHER)
Admission: RE | Admit: 2017-12-20 | Discharge: 2017-12-20 | Disposition: A | Discharge status: Home / Self Care | Payer: BLUE CROSS/BLUE SHIELD | Source: Ambulatory Visit | Attending: Surgery | Admitting: Surgery

## 2017-12-20 DIAGNOSIS — Z01812 Encounter for preprocedural laboratory examination: Secondary | ICD-10-CM | POA: Insufficient documentation

## 2017-12-20 DIAGNOSIS — Z01810 Encounter for preprocedural cardiovascular examination: Secondary | ICD-10-CM | POA: Diagnosis present

## 2017-12-20 LAB — BASIC METABOLIC PANEL
ANION GAP: 12 (ref 5–15)
BUN: 15 mg/dL (ref 6–20)
CO2: 26 mmol/L (ref 22–32)
CREATININE: 0.83 mg/dL (ref 0.44–1.00)
Calcium: 9.1 mg/dL (ref 8.9–10.3)
Chloride: 105 mmol/L (ref 98–111)
GFR calc Af Amer: >60 mL/min (ref >60)
GLUCOSE: 114 mg/dL — AB (ref 70–99)
Potassium: 4 mmol/L (ref 3.5–5.1)
Sodium: 143 mmol/L (ref 135–145)

## 2017-12-20 NOTE — Progress Notes (Signed)
Ensure pre surgery drink given with instructions to complete by 0400 dos,surgical soap given with instructions,  pt verbalized understanding. 

## 2017-12-23 NOTE — H&P (Signed)
Jodi Jimenez  Location: [facility] Surgery Patient #: [id number] DOB: 14-Aug-1960 Married / Language: English / Race: White Female  History of Present Illness   The patient is a 57 year old female who presents with a complaint of right breast abnormality.  The PCP is Dr. Lenetta Jimenez.  Sees Dr. Jacqulynn Jimenez for GYN.  The patient was referred by Dr. Alfonso Jimenez. Jodi Jimenez. Her husband, Jodi Jimenez, is with her.  She had a mammogram at [facility] on 11/29/2017. She has a 9 mm area in the rightg breast at 12 o'clock that is suspicious. She had a right breast biopsy on 12/06/2017 at [place]. Her biopsy - [id number] - shows complex sclerosing lesion. She said that she had a nipple discharge, but this is resolved. She said they had been watching her right breast. She has no prior history of breast biopsy. No family history of breast cancer. She is not on female hormones medication.  I discussed with her a right breast lumpectomy using seed localization, the risk of surgery, and the postoperative care.   Past Medical History: 1. Robotic sacrocolpopexy on 03/12/2016 - Jodi Jimenez 2. Cholecystectomy 3. Vaginal hysterectomy 4. Incisional hernia just superior to the umbilicus 5. HTN 6. She says that she passes out 2 to 4 times per year - She sees [facility] for this. Dr. Caryl Jimenez made the initial dx.  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  Social History: Her husband, Jodi Jimenez, is with her. She has 3 children: 58 yo daughter, 50 yo, and 77 yo Jodi Jimenez, and 7 grandchildren  She works at the [employer] in [employer]   Past Surgical History (Jodi Pho, LPN; 0/97/3532 9:92 PM) Breast Biopsy  Right. Gallbladder Surgery - Laparoscopic  Hysterectomy (not due to cancer) - Partial   Diagnostic Studies History (Jodi Massenburg, LPN; 08/21/8339 9:62 PM) Colonoscopy  5-10 years ago Mammogram  1-3 years ago  Allergies [facility], LPN;  2/29/7989 2:11 PM) No Known Drug Allergies [12/16/2017]:  Medication History (Jodi Massenburg, LPN; 9/41/7408 1:44 PM) Estradiol (0.5MG  Tablet, Oral) Active. Lisinopril-Hydrochlorothiazide (20-25MG  Tablet, Oral) Active. HydrOXYzine HCl (25MG  Tablet, Oral) Active. Benzonatate (200MG  Capsule, Oral) Active.  Social History (Jodi Massenburg, LPN; 12/12/5629 4:97 PM) Alcohol use  Occasional alcohol use. Caffeine use  Carbonated beverages, Tea. No drug use  Tobacco use  Never smoker.  Family History (Jodi Massenburg, LPN; 0/26/3785 8:85 PM) Arthritis  Mother. Cerebrovascular Accident  Mother. Depression  Mother. Diabetes Mellitus  Mother. Heart Disease  Father, Mother. Heart disease in female family member before age 51  Hypertension  Brother, Father, Mother.  Pregnancy / Birth History Jodi Pho, LPN; 0/27/7412 8:78 PM) Age at menarche  4 years. Age of menopause  87-55 Gravida  3 Maternal age  <15 Para  58  Other Problems (Jodi Massenburg, LPN; 6/76/7209 4:70 PM) Back Pain  Gastroesophageal Reflux Disease  High blood pressure  Lump In Breast    Review of Systems (Jodi Massenburg LPN; 9/62/8366 2:94 PM) General Present- Fatigue and Weight Gain. Not Present- Appetite Loss, Chills, Fever, Night Sweats and Weight Loss. HEENT Present- Wears glasses/contact lenses. Not Present- Earache, Hearing Loss, Hoarseness, Nose Bleed, Oral Ulcers, Ringing in the Ears, Seasonal Allergies, Sinus Pain, Sore Throat, Visual Disturbances and Yellow Eyes. Respiratory Present- Snoring. Not Present- Bloody sputum, Chronic Cough, Difficulty Breathing and Wheezing. Breast Present- Breast Mass and Breast Pain. Not Present- Nipple Discharge and Skin Changes. Cardiovascular Present- Leg Cramps and Swelling of Extremities. Not Present- Chest Pain, Difficulty Breathing Lying  Down, Palpitations, Rapid Heart Rate and Shortness of Breath. Gastrointestinal Present-  Nausea. Not Present- Abdominal Pain, Bloating, Bloody Stool, Change in Bowel Habits, Chronic diarrhea, Constipation, Difficulty Swallowing, Excessive gas, Gets full quickly at meals, Hemorrhoids, Indigestion, Rectal Pain and Vomiting. Female Genitourinary Not Present- Frequency, Nocturia, Painful Urination, Pelvic Pain and Urgency. Musculoskeletal Present- Back Pain and Joint Stiffness. Not Present- Joint Pain, Muscle Pain, Muscle Weakness and Swelling of Extremities. Neurological Present- Headaches. Not Present- Decreased Memory, Fainting, Numbness, Seizures, Tingling, Tremor, Trouble walking and Weakness. Endocrine Present- Hot flashes. Not Present- Cold Intolerance, Excessive Hunger, Hair Changes, Heat Intolerance and New Diabetes.  Vitals (Jodi Massenburg LPN; 06/18/9796 9:21 PM) 12/16/2017 3:00 PM Weight: 196.38 lb Height: 62in Body Surface Area: 1.9 m Body Mass Index: 35.92 kg/m  Temp.: 67F  Pulse: 83 (Regular)  BP: 110/82 (Sitting, Left Arm, Standard)   Physical Exam  General: WN somewhat overweight WF who is alert and generally healthy appearing. HEENT: Normal. Pupils equal.  Neck: Supple. No mass. No thyroid mass. Lymph Nodes: No supraclavicular or cervical nodes.  Lungs: Clear to auscultation and symmetric breath sounds. Heart: RRR. No murmur or rub.  Breasts: Right - tender at 12 o'clock, in the area of the biopsy, but I don't feel any mass Left breast - no mass  Abdomen: Soft. No mass. No tenderness. No hernia. Normal bowel sounds. Incision above the umbilicus - she said that is was from the gall bladder surgery - she has a hernia that makes a 5-6 cm mass. I have trouble feeling the fascial edges of the hernia Rectal: Not done.  Extremities: Good strength and ROM in upper and lower extremities. Her right arm has a rash - that changes color with the temperature change. It never has been given a name.  Neurologic: Grossly intact to motor and sensory  function. Psychiatric: Has normal mood and affect. Behavior is normal.  Assessment & Plan  1.  BREAST MASS, RIGHT (N63.10)  Story: Right breast biopsy on 12/06/2017 at [place]. Her biopsy - [id number] - shows complex sclerosing lesion.  Plan:   1) Right breast lumpectomy (seed)  2. Robotic sacrocolpopexy on 03/12/2016 - Jodi Jimenez 3. Vaginal hysterectomy 4. Incisional hernia just superior to the umbilicus 5. HTN 6. She says that she passes out 2 to 4 times per year - She sees [facility] cards for this. Dr. Caryl Jimenez made the initial dx.  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  Alphonsa Overall, MD, [facility] Pager: [phone] Office phone:  [phone]

## 2017-12-24 ENCOUNTER — Ambulatory Visit (HOSPITAL_BASED_OUTPATIENT_CLINIC_OR_DEPARTMENT_OTHER)
Admission: RE | Admit: 2017-12-24 | Discharge: 2017-12-24 | Disposition: A | Discharge status: Home / Self Care | Payer: BLUE CROSS/BLUE SHIELD | Source: Ambulatory Visit | Attending: Surgery | Admitting: Surgery

## 2017-12-24 ENCOUNTER — Encounter (HOSPITAL_BASED_OUTPATIENT_CLINIC_OR_DEPARTMENT_OTHER): Payer: Self-pay | Admitting: *Deleted

## 2017-12-24 ENCOUNTER — Ambulatory Visit (HOSPITAL_BASED_OUTPATIENT_CLINIC_OR_DEPARTMENT_OTHER): Payer: BLUE CROSS/BLUE SHIELD | Admitting: Anesthesiology

## 2017-12-24 ENCOUNTER — Encounter (HOSPITAL_BASED_OUTPATIENT_CLINIC_OR_DEPARTMENT_OTHER)
Admission: RE | Disposition: A | Discharge status: Home / Self Care | Payer: Self-pay | Source: Ambulatory Visit | Attending: Surgery

## 2017-12-24 ENCOUNTER — Other Ambulatory Visit: Payer: Self-pay

## 2017-12-24 DIAGNOSIS — N6091 Unspecified benign mammary dysplasia of right breast: Secondary | ICD-10-CM | POA: Diagnosis not present

## 2017-12-24 DIAGNOSIS — M1990 Unspecified osteoarthritis, unspecified site: Secondary | ICD-10-CM | POA: Insufficient documentation

## 2017-12-24 DIAGNOSIS — E669 Obesity, unspecified: Secondary | ICD-10-CM | POA: Diagnosis not present

## 2017-12-24 DIAGNOSIS — I10 Essential (primary) hypertension: Secondary | ICD-10-CM | POA: Insufficient documentation

## 2017-12-24 DIAGNOSIS — Z6835 Body mass index (BMI) 35.0-35.9, adult: Secondary | ICD-10-CM | POA: Insufficient documentation

## 2017-12-24 DIAGNOSIS — N63 Unspecified lump in unspecified breast: Secondary | ICD-10-CM | POA: Diagnosis present

## 2017-12-24 DIAGNOSIS — K219 Gastro-esophageal reflux disease without esophagitis: Secondary | ICD-10-CM | POA: Insufficient documentation

## 2017-12-24 DIAGNOSIS — C50911 Malignant neoplasm of unspecified site of right female breast: Secondary | ICD-10-CM | POA: Diagnosis not present

## 2017-12-24 DIAGNOSIS — N6021 Fibroadenosis of right breast: Secondary | ICD-10-CM | POA: Insufficient documentation

## 2017-12-24 HISTORY — DX: Unspecified lump in the right breast, unspecified quadrant: N63.10

## 2017-12-24 HISTORY — DX: Abnormal results of other function studies of central nervous system: R94.09

## 2017-12-24 HISTORY — PX: BREAST LUMPECTOMY WITH RADIOACTIVE SEED LOCALIZATION: SHX6424

## 2017-12-24 SURGERY — BREAST LUMPECTOMY WITH RADIOACTIVE SEED LOCALIZATION
Anesthesia: General | Site: Breast | Laterality: Right

## 2017-12-24 MED ORDER — PROMETHAZINE HCL 25 MG/ML IJ SOLN
INTRAMUSCULAR | Status: AC
  Filled 2017-12-24: qty 1

## 2017-12-24 MED ORDER — MIDAZOLAM HCL 2 MG/2ML IJ SOLN
INTRAMUSCULAR | Status: AC
  Filled 2017-12-24: qty 2

## 2017-12-24 MED ORDER — MIDAZOLAM HCL 5 MG/5ML IJ SOLN
INTRAMUSCULAR | Status: DC | PRN
  Administered 2017-12-24: 2 mg via INTRAVENOUS

## 2017-12-24 MED ORDER — CHLORHEXIDINE GLUCONATE CLOTH 2 % EX PADS: 6.0000 | MEDICATED_PAD | Freq: Once | CUTANEOUS | Status: DC

## 2017-12-24 MED ORDER — PROMETHAZINE HCL 25 MG/ML IJ SOLN
6.2500 mg | INTRAMUSCULAR | Status: DC | PRN
  Administered 2017-12-24: 6.25 mg via INTRAVENOUS

## 2017-12-24 MED ORDER — SCOPOLAMINE 1 MG/3DAYS TD PT72: 1.0000 | MEDICATED_PATCH | Freq: Once | TRANSDERMAL | Status: DC | PRN

## 2017-12-24 MED ORDER — FENTANYL CITRATE (PF) 100 MCG/2ML IJ SOLN
INTRAMUSCULAR | Status: AC
  Filled 2017-12-24: qty 2

## 2017-12-24 MED ORDER — ONDANSETRON HCL 4 MG/2ML IJ SOLN
INTRAMUSCULAR | Status: DC | PRN
  Administered 2017-12-24: 4 mg via INTRAVENOUS

## 2017-12-24 MED ORDER — MIDAZOLAM HCL 2 MG/2ML IJ SOLN: 1.0000 mg | INTRAMUSCULAR | Status: DC | PRN

## 2017-12-24 MED ORDER — PROPOFOL 10 MG/ML IV BOLUS
INTRAVENOUS | Status: DC | PRN
  Administered 2017-12-24: 200 mg via INTRAVENOUS

## 2017-12-24 MED ORDER — BUPIVACAINE-EPINEPHRINE (PF) 0.25% -1:200000 IJ SOLN
INTRAMUSCULAR | Status: AC
  Filled 2017-12-24: qty 30

## 2017-12-24 MED ORDER — EPHEDRINE SULFATE 50 MG/ML IJ SOLN
INTRAMUSCULAR | Status: DC | PRN
  Administered 2017-12-24: 10 mg via INTRAVENOUS

## 2017-12-24 MED ORDER — FENTANYL CITRATE (PF) 100 MCG/2ML IJ SOLN: 50.0000 ug | INTRAMUSCULAR | Status: DC | PRN

## 2017-12-24 MED ORDER — GABAPENTIN 300 MG PO CAPS
300.0000 mg | ORAL_CAPSULE | ORAL | Status: AC
  Administered 2017-12-24: 300 mg via ORAL

## 2017-12-24 MED ORDER — CEFAZOLIN SODIUM-DEXTROSE 2-4 GM/100ML-% IV SOLN
INTRAVENOUS | Status: AC
  Filled 2017-12-24: qty 100

## 2017-12-24 MED ORDER — ONDANSETRON HCL 4 MG/2ML IJ SOLN
INTRAMUSCULAR | Status: AC
  Filled 2017-12-24: qty 2

## 2017-12-24 MED ORDER — CEFAZOLIN SODIUM-DEXTROSE 2-4 GM/100ML-% IV SOLN
2.0000 g | INTRAVENOUS | Status: AC
  Administered 2017-12-24: 2 g via INTRAVENOUS

## 2017-12-24 MED ORDER — BUPIVACAINE-EPINEPHRINE 0.25% -1:200000 IJ SOLN
INTRAMUSCULAR | Status: DC | PRN
  Administered 2017-12-24: 20 mL

## 2017-12-24 MED ORDER — TRAMADOL HCL 50 MG PO TABS: 50.0000 mg | ORAL_TABLET | Freq: Four times a day (QID) | ORAL | 0 refills | Status: AC | PRN

## 2017-12-24 MED ORDER — GABAPENTIN 300 MG PO CAPS
ORAL_CAPSULE | ORAL | Status: AC
  Filled 2017-12-24: qty 1

## 2017-12-24 MED ORDER — SCOPOLAMINE 1 MG/3DAYS TD PT72
1.0000 | MEDICATED_PATCH | Freq: Once | TRANSDERMAL | Status: DC
  Administered 2017-12-24: 1.5 mg via TRANSDERMAL

## 2017-12-24 MED ORDER — FENTANYL CITRATE (PF) 100 MCG/2ML IJ SOLN
25.0000 ug | INTRAMUSCULAR | Status: DC | PRN
  Administered 2017-12-24: 50 ug via INTRAVENOUS
  Administered 2017-12-24 (×2): 25 ug via INTRAVENOUS

## 2017-12-24 MED ORDER — ACETAMINOPHEN 500 MG PO TABS
ORAL_TABLET | ORAL | Status: AC
  Filled 2017-12-24: qty 2

## 2017-12-24 MED ORDER — LIDOCAINE 2% (20 MG/ML) 5 ML SYRINGE
INTRAMUSCULAR | Status: AC
  Filled 2017-12-24: qty 5

## 2017-12-24 MED ORDER — DEXAMETHASONE SODIUM PHOSPHATE 10 MG/ML IJ SOLN
INTRAMUSCULAR | Status: AC
  Filled 2017-12-24: qty 1

## 2017-12-24 MED ORDER — LACTATED RINGERS IV SOLN
INTRAVENOUS | Status: DC
  Administered 2017-12-24 (×2): via INTRAVENOUS

## 2017-12-24 MED ORDER — DEXAMETHASONE SODIUM PHOSPHATE 4 MG/ML IJ SOLN
INTRAMUSCULAR | Status: DC | PRN
  Administered 2017-12-24: 10 mg via INTRAVENOUS

## 2017-12-24 MED ORDER — LIDOCAINE HCL (CARDIAC) PF 100 MG/5ML IV SOSY
PREFILLED_SYRINGE | INTRAVENOUS | Status: DC | PRN
  Administered 2017-12-24: 30 mg via INTRAVENOUS

## 2017-12-24 MED ORDER — ACETAMINOPHEN 500 MG PO TABS
1000.0000 mg | ORAL_TABLET | ORAL | Status: AC
  Administered 2017-12-24: 1000 mg via ORAL

## 2017-12-24 MED ORDER — SCOPOLAMINE 1 MG/3DAYS TD PT72
MEDICATED_PATCH | TRANSDERMAL | Status: AC
  Filled 2017-12-24: qty 1

## 2017-12-24 MED ORDER — FENTANYL CITRATE (PF) 100 MCG/2ML IJ SOLN
INTRAMUSCULAR | Status: DC | PRN
  Administered 2017-12-24: 100 ug via INTRAVENOUS

## 2017-12-24 SURGICAL SUPPLY — 57 items
ADH SKN CLS APL DERMABOND .7 (GAUZE/BANDAGES/DRESSINGS) ×1
APL SKNCLS STERI-STRIP NONHPOA (GAUZE/BANDAGES/DRESSINGS)
BENZOIN TINCTURE PRP APPL 2/3 (GAUZE/BANDAGES/DRESSINGS) IMPLANT
BINDER BREAST LRG (GAUZE/BANDAGES/DRESSINGS) IMPLANT
BINDER BREAST MEDIUM (GAUZE/BANDAGES/DRESSINGS) IMPLANT
BINDER BREAST XLRG (GAUZE/BANDAGES/DRESSINGS) IMPLANT
BINDER BREAST XXLRG (GAUZE/BANDAGES/DRESSINGS) ×2 IMPLANT
BLADE HEX COATED 2.75 (ELECTRODE) IMPLANT
BLADE SURG 10 STRL SS (BLADE) ×3 IMPLANT
BLADE SURG 15 STRL LF DISP TIS (BLADE) ×1 IMPLANT
BLADE SURG 15 STRL SS (BLADE) ×3
CANISTER SUC SOCK COL 7IN (MISCELLANEOUS) IMPLANT
CANISTER SUCT 1200ML W/VALVE (MISCELLANEOUS) ×3 IMPLANT
CHLORAPREP W/TINT 26ML (MISCELLANEOUS) ×3 IMPLANT
CLIP VESOCCLUDE SM WIDE 6/CT (CLIP) ×2 IMPLANT
CLOSURE WOUND 1/4X4 (GAUZE/BANDAGES/DRESSINGS)
COVER BACK TABLE 60X90IN (DRAPES) ×3 IMPLANT
COVER MAYO STAND STRL (DRAPES) ×3 IMPLANT
COVER PROBE W GEL 5X96 (DRAPES) ×3 IMPLANT
DECANTER SPIKE VIAL GLASS SM (MISCELLANEOUS) ×2 IMPLANT
DERMABOND ADVANCED (GAUZE/BANDAGES/DRESSINGS) ×2
DERMABOND ADVANCED .7 DNX12 (GAUZE/BANDAGES/DRESSINGS) ×1 IMPLANT
DEVICE DUBIN W/COMP PLATE 8390 (MISCELLANEOUS) ×3 IMPLANT
DRAPE LAPAROTOMY 100X72 PEDS (DRAPES) ×3 IMPLANT
DRAPE UTILITY XL STRL (DRAPES) ×3 IMPLANT
DRSG PAD ABDOMINAL 8X10 ST (GAUZE/BANDAGES/DRESSINGS) IMPLANT
ELECT COATED BLADE 2.86 ST (ELECTRODE) ×3 IMPLANT
ELECT REM PT RETURN 9FT ADLT (ELECTROSURGICAL) ×3
ELECTRODE REM PT RTRN 9FT ADLT (ELECTROSURGICAL) ×1 IMPLANT
GAUZE SPONGE 4X4 12PLY STRL LF (GAUZE/BANDAGES/DRESSINGS) IMPLANT
GLOVE BIO SURGEON STRL SZ 6.5 (GLOVE) ×1 IMPLANT
GLOVE BIO SURGEONS STRL SZ 6.5 (GLOVE) ×1
GLOVE BIOGEL PI IND STRL 7.0 (GLOVE) IMPLANT
GLOVE BIOGEL PI INDICATOR 7.0 (GLOVE) ×2
GLOVE SURG SIGNA 7.5 PF LTX (GLOVE) ×3 IMPLANT
GOWN STRL REUS W/ TWL LRG LVL3 (GOWN DISPOSABLE) ×1 IMPLANT
GOWN STRL REUS W/ TWL XL LVL3 (GOWN DISPOSABLE) ×1 IMPLANT
GOWN STRL REUS W/TWL LRG LVL3 (GOWN DISPOSABLE) ×3
GOWN STRL REUS W/TWL XL LVL3 (GOWN DISPOSABLE) ×3
KIT MARKER MARGIN INK (KITS) ×3 IMPLANT
NDL HYPO 25X1 1.5 SAFETY (NEEDLE) ×1 IMPLANT
NEEDLE HYPO 25X1 1.5 SAFETY (NEEDLE) ×3 IMPLANT
NS IRRIG 1000ML POUR BTL (IV SOLUTION) IMPLANT
PACK BASIN DAY SURGERY FS (CUSTOM PROCEDURE TRAY) ×3 IMPLANT
PENCIL BUTTON HOLSTER BLD 10FT (ELECTRODE) ×3 IMPLANT
SHEET MEDIUM DRAPE 40X70 STRL (DRAPES) IMPLANT
SLEEVE SCD COMPRESS KNEE MED (MISCELLANEOUS) ×3 IMPLANT
SPONGE LAP 18X18 RF (DISPOSABLE) ×3 IMPLANT
STRIP CLOSURE SKIN 1/4X4 (GAUZE/BANDAGES/DRESSINGS) IMPLANT
SUT MNCRL AB 4-0 PS2 18 (SUTURE) ×3 IMPLANT
SUT VICRYL 3-0 CR8 SH (SUTURE) ×3 IMPLANT
SYR CONTROL 10ML LL (SYRINGE) ×3 IMPLANT
TOWEL GREEN STERILE FF (TOWEL DISPOSABLE) ×3 IMPLANT
TOWEL OR NON WOVEN STRL DISP B (DISPOSABLE) ×1 IMPLANT
TUBE CONNECTING 20'X1/4 (TUBING) ×1
TUBE CONNECTING 20X1/4 (TUBING) ×2 IMPLANT
YANKAUER SUCT BULB TIP NO VENT (SUCTIONS) ×3 IMPLANT

## 2017-12-24 NOTE — Anesthesia Preprocedure Evaluation (Addendum)
Anesthesia Evaluation  Patient identified by MRN, date of birth, ID band Patient awake    Reviewed: Allergy & Precautions, NPO status , Patient's Chart, lab work & pertinent test results  History of Anesthesia Complications (+) PONV and history of anesthetic complications  Airway Mallampati: II  TM Distance: >3 FB Neck ROM: Full    Dental  (+) Teeth Intact, Dental Advisory Given   Pulmonary neg pulmonary ROS,    Pulmonary exam normal breath sounds clear to auscultation       Cardiovascular hypertension, Pt. on medications (-) angina(-) Past MI Normal cardiovascular exam Rhythm:Regular Rate:Normal     Neuro/Psych negative neurological ROS  negative psych ROS   GI/Hepatic Neg liver ROS, GERD  Controlled,  Endo/Other  Obesity   Renal/GU negative Renal ROS     Musculoskeletal  (+) Arthritis ,   Abdominal   Peds  Hematology negative hematology ROS (+)   Anesthesia Other Findings Day of surgery medications reviewed with the patient.  Right breast mass   Reproductive/Obstetrics                            Anesthesia Physical Anesthesia Plan  ASA: II  Anesthesia Plan: General   Post-op Pain Management:    Induction: Intravenous  PONV Risk Score and Plan: 4 or greater and Midazolam, Scopolamine patch - Pre-op, Diphenhydramine, Dexamethasone, Ondansetron and TIVA  Airway Management Planned: LMA  Additional Equipment:   Intra-op Plan:   Post-operative Plan: Extubation in OR  Informed Consent: I have reviewed the patients History and Physical, chart, labs and discussed the procedure including the risks, benefits and alternatives for the proposed anesthesia with the patient or authorized representative who has indicated his/her understanding and acceptance.   Dental advisory given  Plan Discussed with: CRNA  Anesthesia Plan Comments:        Anesthesia Quick Evaluation

## 2017-12-24 NOTE — Anesthesia Postprocedure Evaluation (Signed)
Anesthesia Post Note  Patient: Jodi Jimenez  Procedure(s) Performed: BREAST LUMPECTOMY WITH RADIOACTIVE SEED LOCALIZATION (Right Breast)     Patient location during evaluation: PACU Anesthesia Type: General Level of consciousness: awake and alert, awake and oriented Pain management: pain level controlled Vital Signs Assessment: post-procedure vital signs reviewed and stable Respiratory status: spontaneous breathing, nonlabored ventilation and respiratory function stable Cardiovascular status: blood pressure returned to baseline and stable Postop Assessment: no apparent nausea or vomiting Anesthetic complications: no    Last Vitals:  Vitals:   12/24/17 0945 12/24/17 1000  BP: 116/76 140/73  Pulse: 64 62  Resp: 14 16  Temp:  36.5 C  SpO2: 94% 95%    Last Pain:  Vitals:   12/24/17 1000  TempSrc:   PainSc: 3                  Catalina Gravel

## 2017-12-24 NOTE — Transfer of Care (Signed)
Immediate Anesthesia Transfer of Care Note  Patient: Jodi Jimenez  Procedure(s) Performed: BREAST LUMPECTOMY WITH RADIOACTIVE SEED LOCALIZATION (Right Breast)  Patient Location: PACU  Anesthesia Type:General  Level of Consciousness: awake  Airway & Oxygen Therapy: Patient Spontanous Breathing and Patient connected to face mask oxygen  Post-op Assessment: Report given to RN and Post -op Vital signs reviewed and stable  Post vital signs: Reviewed and stable  Last Vitals:  Vitals Value Taken Time  BP    Temp    Pulse 84 12/24/2017  9:00 AM  Resp    SpO2 99 % 12/24/2017  9:00 AM  Vitals shown include unvalidated device data.  Last Pain:  Vitals:   12/24/17 0631  TempSrc: Oral  PainSc: 0-No pain      Patients Stated Pain Goal: 0 (39/67/28 9791)  Complications: No apparent anesthesia complications

## 2017-12-24 NOTE — Interval H&P Note (Signed)
History and Physical Interval Note:  12/24/2017 7:25 AM  Jodi Jimenez  has presented today for surgery, with the diagnosis of RIGHT BREAST COMPLEX SCLEROSING LESION  The various methods of treatment have been discussed with the patient and family.  Husband at bedside.  After consideration of risks, benefits and other options for treatment, the patient has consented to  Procedure(s): BREAST LUMPECTOMY WITH RADIOACTIVE SEED LOCALIZATION (Right) as a surgical intervention .  The patient's history has been reviewed, patient examined, no change in status, stable for surgery.  I have reviewed the patient's chart and labs.  Questions were answered to the patient's satisfaction.     Shann Medal

## 2017-12-24 NOTE — Discharge Instructions (Signed)
[facility] - DISCHARGE INSTRUCTIONS TO PATIENT  Activity:  Driving - May drive tomorrow, if doing well and off pain meds   Lifting - No lifting more than 15 pounds for 5 days, then no limit  Wound Care:   Leave dressing on for 2 days, then you may remove the dressing and shower.  Diet:  As tolerated  Follow up appointment:  Call Dr. Pollie Friar office [facility]) at [phone] for an appointment in 2 to 3 weeks.  Medications and dosages:  Resume your home medications.  You have a prescription for:  Ultram  Call Dr. Lucia Gaskins or his office  [phone]) if you have:  Temperature greater than 100.4,  Persistent nausea and vomiting,  Severe uncontrolled pain,  Redness, tenderness, or signs of infection (pain, swelling, redness, odor or green/yellow discharge around the site),  Difficulty breathing, headache or visual disturbances,  Any other questions or concerns you may have after discharge.  In an emergency, call 911 or go to an Emergency Department at a nearby hospital.   Post Anesthesia Home Care Instructions  Activity: Get plenty of rest for the remainder of the day. A responsible individual must stay with you for 24 hours following the procedure.  For the next 24 hours, DO NOT: -Drive a car -Paediatric nurse -Drink alcoholic beverages -Take any medication unless instructed by your physician -Make any legal decisions or sign important papers.  Meals: Start with liquid foods such as gelatin or soup. Progress to regular foods as tolerated. Avoid greasy, spicy, heavy foods. If nausea and/or vomiting occur, drink only clear liquids until the nausea and/or vomiting subsides. Call your physician if vomiting continues.  Special Instructions/Symptoms: Your throat may feel dry or sore from the anesthesia or the breathing tube placed in your throat during surgery. If this causes discomfort, gargle with warm salt water. The discomfort should disappear  within 24 hours.  If you had a scopolamine patch placed behind your ear for the management of post- operative nausea and/or vomiting:  1. The medication in the patch is effective for 72 hours, after which it should be removed.  Wrap patch in a tissue and discard in the trash. Wash hands thoroughly with soap and water. 2. You may remove the patch earlier than 72 hours if you experience unpleasant side effects which may include dry mouth, dizziness or visual disturbances. 3. Avoid touching the patch. Wash your hands with soap and water after contact with the patch.     Post Anesthesia Home Care Instructions  Activity: Get plenty of rest for the remainder of the day. A responsible individual must stay with you for 24 hours following the procedure.  For the next 24 hours, DO NOT: -Drive a car -Paediatric nurse -Drink alcoholic beverages -Take any medication unless instructed by your physician -Make any legal decisions or sign important papers.  Meals: Start with liquid foods such as gelatin or soup. Progress to regular foods as tolerated. Avoid greasy, spicy, heavy foods. If nausea and/or vomiting occur, drink only clear liquids until the nausea and/or vomiting subsides. Call your physician if vomiting continues.  Special Instructions/Symptoms: Your throat may feel dry or sore from the anesthesia or the breathing tube placed in your throat during surgery. If this causes discomfort, gargle with warm salt water. The discomfort should disappear within 24 hours.  If you had a scopolamine patch placed behind your ear for the management of post- operative nausea and/or vomiting:  1. The medication in the patch is  effective for 72 hours, after which it should be removed.  Wrap patch in a tissue and discard in the trash. Wash hands thoroughly with soap and water. 2. You may remove the patch earlier than 72 hours if you experience unpleasant side effects which may include dry mouth, dizziness or  visual disturbances. 3. Avoid touching the patch. Wash your hands with soap and water after contact with the patch.

## 2017-12-24 NOTE — Anesthesia Procedure Notes (Signed)
Procedure Name: LMA Insertion Date/Time: 12/24/2017 7:34 AM Performed by: Marrianne Mood, CRNA Pre-anesthesia Checklist: Patient identified, Emergency Drugs available, Suction available, Patient being monitored and Timeout performed Patient Re-evaluated:Patient Re-evaluated prior to induction Oxygen Delivery Method: Circle system utilized Preoxygenation: Pre-oxygenation with 100% oxygen Induction Type: IV induction Ventilation: Mask ventilation without difficulty LMA: LMA inserted LMA Size: 4.0 Number of attempts: 1 Airway Equipment and Method: Bite block Placement Confirmation: positive ETCO2 Tube secured with: Tape Dental Injury: Teeth and Oropharynx as per pre-operative assessment

## 2017-12-24 NOTE — Op Note (Signed)
12/24/2017  8:42 AM  PATIENT:  Jodi Jimenez DOB: 12/24/1960 MRN: [id number]  PREOP DIAGNOSIS:   RIGHT BREAST COMPLEX SCLEROSING LESION  POSTOP DIAGNOSIS:    Right breast complex sclerosing lesion, 12:30 o'clock position   PROCEDURE:   Procedure(s):  Right BREAST LUMPECTOMY WITH RADIOACTIVE SEED LOCALIZATION  SURGEON:   Alphonsa Overall, M.D.  ANESTHESIA:   general  Anesthesiologist: Catalina Gravel, MD CRNA: Marrianne Mood, CRNA  General  EBL:  minimal  ml  DRAINS:  none   LOCAL MEDICATIONS USED:   20 cc of 1/4% marcaine  SPECIMEN:   Right breast lumpectomy (6 color paint)  COUNTS CORRECT:  YES  INDICATIONS FOR PROCEDURE:  [name] is a 57 y.o. (DOB: August 23, 1960) white female whose primary care physician is Celene Squibb, MD and comes for right breast lumpectomy.   She had a biopsy of her right breast that shows a complex sclerosing lesion.  She comes for excision of this area of the breast.  The indications and potential complications of surgery were explained to the patient. Potential complications include, but are not limited to, bleeding, infection, the need for further surgery, and nerve injury.     She had a I131 seed placed on 12/23/2017 in her right breast at [facility].  The seed is in the 12:30 o'clock position of the right breast.     OPERATIVE NOTE:   The patient was taken to operating room # 1 at [facility] where she underwent a general anesthesia  supervised by Anesthesiologist: Catalina Gravel, MD CRNA: Marrianne Mood, CRNA. Her right breast was prepped with  ChloraPrep and sterilely draped.    A time-out and the surgical check list was reviewed.    The mass and seed were about at the 12:30 o'clock position of the right breast.   I used the Neoprobe to identify the I131 seed.  I tried to excise an area around the tumor of at least 1 cm.    I excised this block of breast tissue approximately 4 cm by 4 cm  in diameter.   I painted the  lumpectomy specimen with the 6 color paint kit and did a specimen mammogram which confirmed the mass, clip, and the seed were all in the right position in the specimen.  The specimen was sent to pathology who called back to confirm that they have the seed and the specimen.   I then irrigated the wound with saline. I infiltrated approximately 20 mL of 1/4% Marcaine between the incisions.  I then closed all the wounds in layers using 3-0 Vicryl sutures for the deep layer. At the skin, I closed the incisions with a 4-0 Monocryl suture. The incisions were then painted with Dermabond.  She had gauze place over the wounds and placed in a breast binder.   The patient tolerated the procedure well, was transported to the recovery room in good condition. Sponge and needle count were correct at the end of the case.   Final pathology is pending.   Alphonsa Overall, MD, [facility] Pager: [phone] Office phone:  [phone]

## 2017-12-28 ENCOUNTER — Encounter (HOSPITAL_BASED_OUTPATIENT_CLINIC_OR_DEPARTMENT_OTHER): Payer: Self-pay | Admitting: Surgery

## 2018-06-10 ENCOUNTER — Other Ambulatory Visit: Payer: Self-pay | Admitting: Surgery

## 2018-06-10 DIAGNOSIS — N6310 Unspecified lump in the right breast, unspecified quadrant: Secondary | ICD-10-CM

## 2018-06-27 ENCOUNTER — Ambulatory Visit
Admission: RE | Admit: 2018-06-27 | Discharge: 2018-06-27 | Disposition: A | Discharge status: Home / Self Care | Payer: BLUE CROSS/BLUE SHIELD | Source: Ambulatory Visit | Attending: Surgery | Admitting: Surgery

## 2018-06-27 DIAGNOSIS — N6310 Unspecified lump in the right breast, unspecified quadrant: Secondary | ICD-10-CM

## 2018-06-27 MED ORDER — GADOBUTROL 1 MMOL/ML IV SOLN
8.0000 mL | Freq: Once | INTRAVENOUS | Status: AC | PRN
  Administered 2018-06-27: 8 mL via INTRAVENOUS

## 2018-08-19 ENCOUNTER — Other Ambulatory Visit: Payer: Self-pay | Admitting: Obstetrics & Gynecology

## 2018-11-21 ENCOUNTER — Other Ambulatory Visit: Payer: Self-pay | Admitting: Surgery

## 2018-11-21 DIAGNOSIS — K432 Incisional hernia without obstruction or gangrene: Secondary | ICD-10-CM

## 2018-11-25 ENCOUNTER — Ambulatory Visit
Admission: RE | Admit: 2018-11-25 | Discharge: 2018-11-25 | Disposition: A | Discharge status: Home / Self Care | Payer: BLUE CROSS/BLUE SHIELD | Source: Ambulatory Visit | Attending: Surgery | Admitting: Surgery

## 2018-11-25 ENCOUNTER — Other Ambulatory Visit: Payer: Self-pay

## 2018-11-25 DIAGNOSIS — K432 Incisional hernia without obstruction or gangrene: Secondary | ICD-10-CM

## 2018-11-25 MED ORDER — IOPAMIDOL (ISOVUE-300) INJECTION 61%
100.0000 mL | Freq: Once | INTRAVENOUS | Status: AC | PRN
  Administered 2018-11-25: 100 mL via INTRAVENOUS

## 2018-11-29 ENCOUNTER — Other Ambulatory Visit (HOSPITAL_COMMUNITY): Payer: Self-pay | Admitting: Surgery

## 2018-12-01 ENCOUNTER — Other Ambulatory Visit: Payer: Self-pay | Admitting: Surgery

## 2018-12-01 DIAGNOSIS — E278 Other specified disorders of adrenal gland: Secondary | ICD-10-CM

## 2018-12-28 ENCOUNTER — Encounter (HOSPITAL_COMMUNITY): Payer: Self-pay

## 2018-12-28 NOTE — Pre-Procedure Instructions (Addendum)
Jodi Jimenez  12/28/2018      [facility] [place], [place] [id number] [place] [address] Phone: [phone] Fax: [phone]  [place], [address] [place] [address] Phone: [phone] Fax: [phone]    Your procedure is scheduled on Thursday, September 10th. .  Report to [facility] at 5:30 A.M.  Call this number if you have problems the morning of surgery:  [phone]   Remember:  Do not eat after midnight.  You may drink clear liquids until 4:30 AM .  Clear liquids allowed are:  Water, Juice (non-citric and without pulp), Carbonated beverages, Clear Tea, Black Coffee only and Gatorade.    Take these medicines the morning of surgery with A SIP OF WATER: None  You may take acetaminophen (TYLENOL)- if needed  As of today, STOP taking any Aspirin (unless otherwise instructed by your surgeon), Aleve, Naproxen, Ibuprofen, Motrin, Advil, Goody's, BC's, all herbal medications, fish oil, and all vitamins.     Do not wear jewelry, make-up or nail polish.  Do not wear lotions, powders, or perfumes, or deodorant.  Do not shave 48 hours prior to surgery.    Do not bring valuables to the hospital.  [facility] is not responsible for any belongings or valuables.   [facility]  Before surgery, you can play an important role. Because skin is not sterile, your skin needs to be as free of germs as possible. You can reduce the number of germs on your skin by washing with CHG (chlorahexidine gluconate) Soap before surgery.  CHG is an antiseptic cleaner which kills germs and bonds with the skin to continue killing germs even after washing.    Oral Hygiene is also important to reduce your risk of infection.  Remember - BRUSH YOUR TEETH THE MORNING OF SURGERY WITH YOUR REGULAR TOOTHPASTE  Please do not use if you have an allergy to CHG or  antibacterial soaps. If your skin becomes reddened/irritated stop using the CHG.  Do not shave (including legs and underarms) for at least 48 hours prior to first CHG shower. It is OK to shave your face.  Please follow these instructions carefully.   1. Shower the NIGHT BEFORE SURGERY and the MORNING OF SURGERY with CHG.   2. If you chose to wash your hair, wash your hair first as usual with your normal shampoo.  3. After you shampoo, rinse your hair and body thoroughly to remove the shampoo.  4. Use CHG as you would any other liquid soap. You can apply CHG directly to the skin and wash gently with a scrungie or a clean washcloth.   5. Apply the CHG Soap to your body ONLY FROM THE NECK DOWN.  Do not use on open wounds or open sores. Avoid contact with your eyes, ears, mouth and genitals (private parts). Wash Face and genitals (private parts)  with your normal soap.  6. Wash thoroughly, paying special attention to the area where your surgery will be performed.  7. Thoroughly rinse your body with warm water from the neck down.  8. DO NOT shower/wash with your normal soap after using and rinsing off the CHG Soap.  9. Pat yourself dry with a CLEAN TOWEL.  10. Wear CLEAN PAJAMAS to bed the night before surgery, wear comfortable clothes the morning of surgery  11. Place CLEAN SHEETS on your  bed the night of your first shower and DO NOT SLEEP WITH PETS.    Day of Surgery:  Do not apply any deodorants/lotions.  Please wear clean clothes to the hospital/surgery center.   Remember to brush your teeth WITH YOUR REGULAR TOOTHPASTE.    Contacts, dentures or bridgework may not be worn into surgery.  Leave your suitcase in the car.  After surgery it may be brought to your room.  For patients admitted to the hospital, discharge time will be determined by your treatment team.  Patients discharged the day of surgery will not be allowed to drive home.

## 2018-12-29 ENCOUNTER — Other Ambulatory Visit: Payer: Self-pay

## 2018-12-29 ENCOUNTER — Encounter (HOSPITAL_COMMUNITY): Payer: Self-pay

## 2018-12-29 ENCOUNTER — Ambulatory Visit
Admission: RE | Admit: 2018-12-29 | Discharge: 2018-12-29 | Disposition: A | Discharge status: Home / Self Care | Payer: BC Managed Care – PPO | Source: Ambulatory Visit | Attending: Surgery | Admitting: Surgery

## 2018-12-29 ENCOUNTER — Encounter (HOSPITAL_COMMUNITY)
Admission: RE | Admit: 2018-12-29 | Discharge: 2018-12-29 | Disposition: A | Discharge status: Home / Self Care | Payer: BC Managed Care – PPO | Source: Ambulatory Visit | Attending: Surgery | Admitting: Surgery

## 2018-12-29 DIAGNOSIS — Z01818 Encounter for other preprocedural examination: Secondary | ICD-10-CM | POA: Insufficient documentation

## 2018-12-29 DIAGNOSIS — E278 Other specified disorders of adrenal gland: Secondary | ICD-10-CM

## 2018-12-29 LAB — BASIC METABOLIC PANEL
Anion gap: 12 (ref 5–15)
BUN: 14 mg/dL (ref 6–20)
CO2: 25 mmol/L (ref 22–32)
Calcium: 9 mg/dL (ref 8.9–10.3)
Chloride: 102 mmol/L (ref 98–111)
Creatinine, Ser: 0.68 mg/dL (ref 0.44–1.00)
GFR calc Af Amer: >60 mL/min (ref >60)
GFR calc non Af Amer: >60 mL/min (ref >60)
Glucose, Bld: 92 mg/dL (ref 70–99)
Potassium: 3.9 mmol/L (ref 3.5–5.1)
Sodium: 139 mmol/L (ref 135–145)

## 2018-12-29 LAB — CBC
HCT: 43.4 % (ref 36.0–46.0)
Hemoglobin: 14.2 g/dL (ref 12.0–15.0)
MCH: 31.8 pg (ref 26.0–34.0)
MCHC: 32.7 g/dL (ref 30.0–36.0)
MCV: 97.1 fL (ref 80.0–100.0)
Platelets: 244 10*3/uL (ref 150–400)
RBC: 4.47 MIL/uL (ref 3.87–5.11)
RDW: 13.1 % (ref 11.5–15.5)
WBC: 10.1 10*3/uL (ref 4.0–10.5)
nRBC: 0 % (ref 0.0–0.2)

## 2018-12-29 MED ORDER — GADOBENATE DIMEGLUMINE 529 MG/ML IV SOLN
18.0000 mL | Freq: Once | INTRAVENOUS | Status: AC | PRN
  Administered 2018-12-29: 19:00:00 18 mL via INTRAVENOUS

## 2018-12-29 NOTE — Progress Notes (Signed)
PCP - Dr. Nevada Crane  Cardiologist - Denies  Chest x-ray - Denies  EKG - 12/29/2018  Stress Test - 01/27/11 (E)  ECHO - 01/27/11 (E)  Cardiac Cath - Denies  AICD-na PM-na LOOP-na  Sleep Study - Denies CPAP - None  LABS- 12/29/2018: CBC, BMP 01/03/2019: COVID  ASA-Denies  ERAS- Yes- no drink   Anesthesia- No  Pt denies having chest pain, sob, or fever at this time. All instructions explained to the pt, with a verbal understanding of the material. Pt agrees to go over the instructions while at home for a better understanding. Pt also instructed to self quarantine after being tested for COVID-19. The opportunity to ask questions was provided.   Coronavirus Screening  Have you experienced the following symptoms:  Cough yes/no: No Fever (>100.20F)  yes/no: No Runny nose yes/no: No Sore throat yes/no: No Difficulty breathing/shortness of breath  yes/no: No  Have you or a family member traveled in the last 14 days and where? yes/no: No   If the patient indicates "YES" to the above questions, their PAT will be rescheduled to limit the exposure to others and, the surgeon will be notified. THE PATIENT WILL NEED TO BE ASYMPTOMATIC FOR 14 DAYS.   If the patient is not experiencing any of these symptoms, the PAT nurse will instruct them to NOT bring anyone with them to their appointment since they may have these symptoms or traveled as well.   Please remind your patients and families that hospital visitation restrictions are in effect and the importance of the restrictions.

## 2019-01-03 ENCOUNTER — Other Ambulatory Visit (HOSPITAL_COMMUNITY)
Admission: RE | Admit: 2019-01-03 | Discharge: 2019-01-03 | Disposition: A | Discharge status: Home / Self Care | Payer: BC Managed Care – PPO | Source: Ambulatory Visit | Attending: Surgery | Admitting: Surgery

## 2019-01-03 DIAGNOSIS — Z20828 Contact with and (suspected) exposure to other viral communicable diseases: Secondary | ICD-10-CM | POA: Diagnosis not present

## 2019-01-03 DIAGNOSIS — Z01812 Encounter for preprocedural laboratory examination: Secondary | ICD-10-CM | POA: Insufficient documentation

## 2019-01-03 LAB — SARS CORONAVIRUS 2 (TAT 6-24 HRS): SARS Coronavirus 2: NEGATIVE

## 2019-01-04 NOTE — Anesthesia Preprocedure Evaluation (Addendum)
Anesthesia Evaluation  Patient identified by MRN, date of birth, ID band Patient awake    Reviewed: Allergy & Precautions, NPO status , Patient's Chart, lab work & pertinent test results  History of Anesthesia Complications (+) PONV  Airway Mallampati: II  TM Distance: >3 FB Neck ROM: Full    Dental  (+) Dental Advisory Given   Pulmonary neg pulmonary ROS,  01/03/2019 SARS coronavirus NEG   Pulmonary exam normal breath sounds clear to auscultation       Cardiovascular hypertension, Pt. on medications (-) anginaNormal cardiovascular exam Rhythm:Regular Rate:Normal  '12 Stress ECHO: normal LVF at rest and stress   Neuro/Psych Postural hypotension    GI/Hepatic Neg liver ROS, GERD  Controlled,  Endo/Other  Morbid obesity  Renal/GU negative Renal ROS     Musculoskeletal  (+) Arthritis , Osteoarthritis,    Abdominal (+) + obese,   Peds  Hematology negative hematology ROS (+)   Anesthesia Other Findings   Reproductive/Obstetrics S/p hysterectomy                            Anesthesia Physical Anesthesia Plan  ASA: II  Anesthesia Plan: General   Post-op Pain Management:    Induction: Intravenous  PONV Risk Score and Plan: 4 or greater and Scopolamine patch - Pre-op, Dexamethasone and Ondansetron  Airway Management Planned: Oral ETT  Additional Equipment:   Intra-op Plan:   Post-operative Plan: Extubation in OR  Informed Consent: I have reviewed the patients History and Physical, chart, labs and discussed the procedure including the risks, benefits and alternatives for the proposed anesthesia with the patient or authorized representative who has indicated his/her understanding and acceptance.     Dental advisory given  Plan Discussed with: CRNA and Surgeon  Anesthesia Plan Comments:        Anesthesia Quick Evaluation

## 2019-01-04 NOTE — H&P (Signed)
Jodi Jimenez  Location: [facility] Patient #: [id number] DOB: 01-Jan-1961 Married / Language: English / Race: White Female   History of Present Illness   The patient is a 58 year old female who presents with a complaint of right breast abnormality.  The PCP is Dr. Merlyn Albert.  Sees Dr. Jacqulynn Cadet for GYN.  She comes by herself.  [The Covid-19 virus has disrupted normal medical care in [place] and across the nation. We have sometimes had to alter normal surgical/medical care to limit this epidemic and we have explained these changes to the patient.]  She had a cholecystectomy and had a robotic sacrocolpoxey in 02/2016 by Dr. Dellis Filbert. She developed an increasing bulge above her umbilicus in the area of the incision for the surgery (she blames this more on the gall bladder surgery). When I saw her for her breast surgery in August 2019, this bulge was about 5 cm across. It seems like the bulge is almost doubled in size since I last saw her. It is starting to bother her, and she is ready to have this hernia repaired. She's had no trouble of stomach, pancreas, or colon disease. I discussed the indications and complications of hernia surgery with the patient. I discussed both the laparoscopic and open approach to hernia repair.. The potential risks of hernia surgery include, but are not limited to, bleeding, infection, open surgery, nerve injury, and recurrence of the hernia. I provided the patient literature about hernia surgery. We talked about the use of mesh in hernias and their risks. The risk of mesh include chronic infection, erosion to other structures, and chronic pain.  Plan: 1) CT of abdomen/pelvis, 2) Laparoscopic repair of ventral incisional hernia  Past Medical History: 1. Right breast biopsy - 12/24/2017 [facility]) Pathology [id number]) shows complex sclerosing lesion, lobular neoplasia (LCIS) Her Tyler-Cuzick 10 year  risk for breast cancer is 15.2%. 2. Robotic sacrocolpopexy on 03/12/2016 - [facility] 3. Cholecystectomy 4. Vaginal hysterectomy 5. Incisional hernia just superior to the umbilicus 6. HTN 7. She says that she passes out 2 to 4 times per year - She sees [place] cards for this. Dr. Caryl Comes made the initial dx.  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 8. Skin splotches that changes color with her temperature change  Social History: Her husband, Jodi Jimenez. She has 3 children: 18 yo daughter, 37 yo, and 45 yo [place], and 7 grandchildren  She works at the [employer] in [place] [employer], [place]; 11/17/2018 4:03 PM) Codeine and Related  No Known Drug Allergies  [12/16/2017]: (Marked as Inactive) Allergies Reconciled   Medication History Nance [place], CMA; 11/17/2018 4:03 PM) Acetaminophen (Oral) Specific strength unknown - Active. Ibuprofen (Oral) Specific strength unknown - Active. Gabapentin (Oral) Specific strength unknown - Active. Zofran (Oral) Specific strength unknown - Active. Estradiol (0.5MG  Tablet, Oral) Active. Lisinopril-Hydrochlorothiazide (20-25MG  Tablet, Oral) Active. HydrOXYzine HCl (25MG  Tablet, Oral) Active. Medications Reconciled  Vitals (Sabrina Canty CMA; 11/17/2018 4:03 PM) 11/17/2018 4:03 PM Weight: 200.38 lb Height: 62in Body Surface Area: 1.91 m Body Mass Index: 36.65 kg/m  Temp.: 52F (Oral)  Pulse: 97 (Regular)  BP: 134/74(Sitting, Left Arm, Standard)   Physical Exam  General: WN somewhat overweight WF who is alert and generally healthy appearing. She is wearing a mask. HEENT: Normal. Pupils equal.  Neck: Supple. No mass. No thyroid mass.  Lymph Nodes: No supraclavicular or cervical nodes.  Cardiac: RRR Pulmonary: Symmetric breath sounds  Abdomen: Soft. No mass.  No tenderness. Normal bowel sounds.  Incision above the umbilicus - she said that is was from the gall bladder  surgery - she has a hernia that makes a 9 cm mass. I have trouble feeling the fascial edges of the hernia The hernia is reducible when she lays down.  Extremities: Good strength and ROM in upper and lower extremities.  Her right arm has a rash - that changes color with the temperature change. It never has been given a name. She says that it fades when she is hot and turns purple when she is cold. She said the her mother called it her "thermostat".  Assessment & Plan  1.  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 Theodore Virgin MD; 11/26/2018 4:01 PM)    CT 11/25/2018 - fasical defect - 3.5 x 4.2 cm    1.5 cm right adrenal nodule they're recommending MRI   2) Laparoscopic repair of ventral incisional hernia  2.  BREAST MASS, RIGHT (N63.10)  Story: Right breast biopsy on 12/06/2017 at [facility]. Her biopsy [id number] - shows complex sclerosing lesion.  Right breast biopsy - 830/2019 - Pathology [id number]) shows complex sclerosing lesion, lobular neoplasia (LCIS).  3.  Right adrenal mass  MRI of abdomen - 12/29/2018 - 1. The 1.5 by 1.3 cm right adrenal mass demonstrates mild signal dropout on out of phase images compared to in phase images. This favors lipid poor adenoma, although the dropout of signal from a subjective visual standpoint is only subtle. I would suggest a follow up noncontrast CT of the upper abdomen in 1 years time to assess for stability.   2. Diffuse hepatic steatosis.  4. Robotic sacrocolpopexy on 03/12/2016 - [facility] 5. HTN 6. She says that she passes out 2 to 4 times per year - She sees [place] cards for this. Dr. Caryl Comes made the initial dx.  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 7. Skin splotches that changes color with her temperature change  Alphonsa Overall, MD, [facility] Surgery Pager: [phone] Office phone:  [phone]

## 2019-01-05 ENCOUNTER — Other Ambulatory Visit: Payer: Self-pay

## 2019-01-05 ENCOUNTER — Ambulatory Visit (HOSPITAL_COMMUNITY): Payer: BC Managed Care – PPO | Admitting: Certified Registered"

## 2019-01-05 ENCOUNTER — Encounter (HOSPITAL_COMMUNITY)
Admission: RE | Disposition: A | Discharge status: Home / Self Care | Payer: Self-pay | Source: Home / Self Care | Attending: Surgery

## 2019-01-05 ENCOUNTER — Encounter (HOSPITAL_COMMUNITY): Payer: Self-pay | Admitting: General Practice

## 2019-01-05 ENCOUNTER — Observation Stay (HOSPITAL_COMMUNITY)
Admission: RE | Admit: 2019-01-05 | Discharge: 2019-01-06 | Disposition: A | Discharge status: Home / Self Care | Payer: BC Managed Care – PPO | Attending: Surgery | Admitting: Surgery

## 2019-01-05 DIAGNOSIS — I951 Orthostatic hypotension: Secondary | ICD-10-CM | POA: Diagnosis not present

## 2019-01-05 DIAGNOSIS — K436 Other and unspecified ventral hernia with obstruction, without gangrene: Secondary | ICD-10-CM | POA: Diagnosis present

## 2019-01-05 DIAGNOSIS — K430 Incisional hernia with obstruction, without gangrene: Principal | ICD-10-CM | POA: Insufficient documentation

## 2019-01-05 DIAGNOSIS — I10 Essential (primary) hypertension: Secondary | ICD-10-CM | POA: Diagnosis not present

## 2019-01-05 DIAGNOSIS — Z9889 Other specified postprocedural states: Secondary | ICD-10-CM | POA: Diagnosis not present

## 2019-01-05 DIAGNOSIS — Z6836 Body mass index (BMI) 36.0-36.9, adult: Secondary | ICD-10-CM | POA: Diagnosis not present

## 2019-01-05 DIAGNOSIS — Z79899 Other long term (current) drug therapy: Secondary | ICD-10-CM | POA: Insufficient documentation

## 2019-01-05 DIAGNOSIS — Z79890 Hormone replacement therapy (postmenopausal): Secondary | ICD-10-CM | POA: Diagnosis not present

## 2019-01-05 DIAGNOSIS — Z90710 Acquired absence of both cervix and uterus: Secondary | ICD-10-CM | POA: Insufficient documentation

## 2019-01-05 DIAGNOSIS — Z9049 Acquired absence of other specified parts of digestive tract: Secondary | ICD-10-CM | POA: Diagnosis not present

## 2019-01-05 HISTORY — PX: VENTRAL HERNIA REPAIR: SHX424

## 2019-01-05 HISTORY — PX: LAPAROSCOPIC ASSISTED VENTRAL HERNIA REPAIR: SHX6312

## 2019-01-05 SURGERY — REPAIR, HERNIA, VENTRAL, LAPAROSCOPIC
Anesthesia: General | Site: Abdomen

## 2019-01-05 MED ORDER — HYDROCHLOROTHIAZIDE 25 MG PO TABS
25.0000 mg | ORAL_TABLET | Freq: Every day | ORAL | Status: DC
  Administered 2019-01-06: 10:00:00 25 mg via ORAL
  Filled 2019-01-05: qty 1

## 2019-01-05 MED ORDER — ACETAMINOPHEN 500 MG PO TABS
1000.0000 mg | ORAL_TABLET | Freq: Three times a day (TID) | ORAL | Status: DC
  Administered 2019-01-05 – 2019-01-06 (×3): 1000 mg via ORAL
  Filled 2019-01-05 (×3): qty 2

## 2019-01-05 MED ORDER — TRAMADOL HCL 50 MG PO TABS: 50.0000 mg | ORAL_TABLET | Freq: Four times a day (QID) | ORAL | Status: DC | PRN

## 2019-01-05 MED ORDER — BUPIVACAINE LIPOSOME 1.3 % IJ SUSP
20.0000 mL | Freq: Once | INTRAMUSCULAR | Status: DC
  Filled 2019-01-05 (×2): qty 20

## 2019-01-05 MED ORDER — CEFAZOLIN SODIUM-DEXTROSE 2-4 GM/100ML-% IV SOLN
INTRAVENOUS | Status: AC
  Filled 2019-01-05: qty 100

## 2019-01-05 MED ORDER — MORPHINE SULFATE (PF) 2 MG/ML IV SOLN: 1.0000 mg | INTRAVENOUS | Status: DC | PRN

## 2019-01-05 MED ORDER — SCOPOLAMINE 1 MG/3DAYS TD PT72
MEDICATED_PATCH | TRANSDERMAL | Status: AC
  Administered 2019-01-05: 07:00:00
  Filled 2019-01-05: qty 1

## 2019-01-05 MED ORDER — HYDROMORPHONE HCL 1 MG/ML IJ SOLN
INTRAMUSCULAR | Status: AC
  Filled 2019-01-05: qty 1

## 2019-01-05 MED ORDER — ENOXAPARIN SODIUM 40 MG/0.4ML ~~LOC~~ SOLN
40.0000 mg | SUBCUTANEOUS | Status: DC
  Administered 2019-01-06: 08:00:00 40 mg via SUBCUTANEOUS
  Filled 2019-01-05: qty 0.4

## 2019-01-05 MED ORDER — ACETAMINOPHEN 500 MG PO TABS
1000.0000 mg | ORAL_TABLET | ORAL | Status: AC
  Administered 2019-01-05: 06:00:00 1000 mg via ORAL

## 2019-01-05 MED ORDER — HYDROCODONE-ACETAMINOPHEN 5-325 MG PO TABS: 1.0000 | ORAL_TABLET | ORAL | Status: DC | PRN

## 2019-01-05 MED ORDER — SCOPOLAMINE 1 MG/3DAYS TD PT72: 1.0000 | MEDICATED_PATCH | Freq: Once | TRANSDERMAL | Status: DC

## 2019-01-05 MED ORDER — ACETAMINOPHEN 500 MG PO TABS
ORAL_TABLET | ORAL | Status: AC
  Filled 2019-01-05: qty 2

## 2019-01-05 MED ORDER — ONDANSETRON HCL 4 MG/2ML IJ SOLN
4.0000 mg | Freq: Four times a day (QID) | INTRAMUSCULAR | Status: DC | PRN
  Administered 2019-01-05: 12:00:00 4 mg via INTRAVENOUS
  Filled 2019-01-05: qty 2

## 2019-01-05 MED ORDER — SUGAMMADEX SODIUM 200 MG/2ML IV SOLN
INTRAVENOUS | Status: DC | PRN
  Administered 2019-01-05: 183.4 mg via INTRAVENOUS

## 2019-01-05 MED ORDER — MIDAZOLAM HCL 5 MG/5ML IJ SOLN
INTRAMUSCULAR | Status: DC | PRN
  Administered 2019-01-05: 2 mg via INTRAVENOUS

## 2019-01-05 MED ORDER — DEXAMETHASONE SODIUM PHOSPHATE 10 MG/ML IJ SOLN
INTRAMUSCULAR | Status: DC | PRN
  Administered 2019-01-05: 10 mg via INTRAVENOUS

## 2019-01-05 MED ORDER — LIDOCAINE 2% (20 MG/ML) 5 ML SYRINGE
INTRAMUSCULAR | Status: AC
  Filled 2019-01-05: qty 5

## 2019-01-05 MED ORDER — HYDROMORPHONE HCL 1 MG/ML IJ SOLN
0.2500 mg | INTRAMUSCULAR | Status: DC | PRN
  Administered 2019-01-05 (×3): 0.5 mg via INTRAVENOUS

## 2019-01-05 MED ORDER — BUPIVACAINE HCL (PF) 0.25 % IJ SOLN
INTRAMUSCULAR | Status: DC | PRN
  Administered 2019-01-05: 20 mL
  Administered 2019-01-05: 3 mL

## 2019-01-05 MED ORDER — DEXAMETHASONE SODIUM PHOSPHATE 10 MG/ML IJ SOLN
INTRAMUSCULAR | Status: AC
  Filled 2019-01-05: qty 1

## 2019-01-05 MED ORDER — MIDAZOLAM HCL 2 MG/2ML IJ SOLN
INTRAMUSCULAR | Status: AC
  Filled 2019-01-05: qty 2

## 2019-01-05 MED ORDER — KETOROLAC TROMETHAMINE 30 MG/ML IJ SOLN
INTRAMUSCULAR | Status: AC
  Filled 2019-01-05: qty 1

## 2019-01-05 MED ORDER — ROCURONIUM BROMIDE 10 MG/ML (PF) SYRINGE
PREFILLED_SYRINGE | INTRAVENOUS | Status: DC | PRN
  Administered 2019-01-05: 50 mg via INTRAVENOUS

## 2019-01-05 MED ORDER — FENTANYL CITRATE (PF) 250 MCG/5ML IJ SOLN
INTRAMUSCULAR | Status: AC
  Filled 2019-01-05: qty 5

## 2019-01-05 MED ORDER — PROPOFOL 10 MG/ML IV BOLUS
INTRAVENOUS | Status: DC | PRN
  Administered 2019-01-05: 120 mg via INTRAVENOUS

## 2019-01-05 MED ORDER — LIDOCAINE 2% (20 MG/ML) 5 ML SYRINGE
INTRAMUSCULAR | Status: DC | PRN
  Administered 2019-01-05: 40 mg via INTRAVENOUS

## 2019-01-05 MED ORDER — CHLORHEXIDINE GLUCONATE CLOTH 2 % EX PADS: 6.0000 | MEDICATED_PAD | Freq: Once | CUTANEOUS | Status: DC

## 2019-01-05 MED ORDER — EPHEDRINE 5 MG/ML INJ
INTRAVENOUS | Status: AC
  Filled 2019-01-05: qty 10

## 2019-01-05 MED ORDER — MEPERIDINE HCL 25 MG/ML IJ SOLN: 6.2500 mg | INTRAMUSCULAR | Status: DC | PRN

## 2019-01-05 MED ORDER — KCL IN DEXTROSE-NACL 20-5-0.45 MEQ/L-%-% IV SOLN
INTRAVENOUS | Status: DC
  Administered 2019-01-05 – 2019-01-06 (×2): via INTRAVENOUS
  Filled 2019-01-05 (×2): qty 1000

## 2019-01-05 MED ORDER — MIDAZOLAM HCL 2 MG/2ML IJ SOLN: 0.5000 mg | Freq: Once | INTRAMUSCULAR | Status: DC | PRN

## 2019-01-05 MED ORDER — ONDANSETRON HCL 4 MG/2ML IJ SOLN
INTRAMUSCULAR | Status: DC | PRN
  Administered 2019-01-05: 4 mg via INTRAVENOUS

## 2019-01-05 MED ORDER — CELECOXIB 200 MG PO CAPS: 200.0000 mg | ORAL_CAPSULE | ORAL | Status: DC

## 2019-01-05 MED ORDER — PROMETHAZINE HCL 25 MG/ML IJ SOLN: 6.2500 mg | INTRAMUSCULAR | Status: DC | PRN

## 2019-01-05 MED ORDER — KETOROLAC TROMETHAMINE 30 MG/ML IJ SOLN
INTRAMUSCULAR | Status: DC | PRN
  Administered 2019-01-05: 30 mg via INTRAVENOUS

## 2019-01-05 MED ORDER — FENTANYL CITRATE (PF) 250 MCG/5ML IJ SOLN
INTRAMUSCULAR | Status: DC | PRN
  Administered 2019-01-05 (×5): 50 ug via INTRAVENOUS

## 2019-01-05 MED ORDER — LACTATED RINGERS IV SOLN
INTRAVENOUS | Status: DC | PRN
  Administered 2019-01-05: 08:00:00 via INTRAVENOUS

## 2019-01-05 MED ORDER — LISINOPRIL 20 MG PO TABS
20.0000 mg | ORAL_TABLET | Freq: Every day | ORAL | Status: DC
  Administered 2019-01-06: 10:00:00 20 mg via ORAL
  Filled 2019-01-05: qty 1

## 2019-01-05 MED ORDER — PROPOFOL 10 MG/ML IV BOLUS
INTRAVENOUS | Status: AC
  Filled 2019-01-05: qty 40

## 2019-01-05 MED ORDER — 0.9 % SODIUM CHLORIDE (POUR BTL) OPTIME
TOPICAL | Status: DC | PRN
  Administered 2019-01-05: 09:00:00 1000 mL

## 2019-01-05 MED ORDER — BUPIVACAINE LIPOSOME 1.3 % IJ SUSP
INTRAMUSCULAR | Status: DC | PRN
  Administered 2019-01-05: 20 mL

## 2019-01-05 MED ORDER — ONDANSETRON 4 MG PO TBDP: 4.0000 mg | ORAL_TABLET | Freq: Four times a day (QID) | ORAL | Status: DC | PRN

## 2019-01-05 MED ORDER — ROCURONIUM BROMIDE 10 MG/ML (PF) SYRINGE
PREFILLED_SYRINGE | INTRAVENOUS | Status: AC
  Filled 2019-01-05: qty 10

## 2019-01-05 MED ORDER — LISINOPRIL-HYDROCHLOROTHIAZIDE 20-25 MG PO TABS: 1.0000 | ORAL_TABLET | Freq: Every day | ORAL | Status: DC

## 2019-01-05 MED ORDER — PHENYLEPHRINE 40 MCG/ML (10ML) SYRINGE FOR IV PUSH (FOR BLOOD PRESSURE SUPPORT)
PREFILLED_SYRINGE | INTRAVENOUS | Status: DC | PRN
  Administered 2019-01-05 (×5): 40 ug via INTRAVENOUS

## 2019-01-05 MED ORDER — CELECOXIB 200 MG PO CAPS
ORAL_CAPSULE | ORAL | Status: AC
  Administered 2019-01-05: 200 mg
  Filled 2019-01-05: qty 1

## 2019-01-05 MED ORDER — CEFAZOLIN SODIUM-DEXTROSE 2-4 GM/100ML-% IV SOLN
2.0000 g | INTRAVENOUS | Status: AC
  Administered 2019-01-05: 2 g via INTRAVENOUS

## 2019-01-05 MED ORDER — PHENYLEPHRINE 40 MCG/ML (10ML) SYRINGE FOR IV PUSH (FOR BLOOD PRESSURE SUPPORT)
PREFILLED_SYRINGE | INTRAVENOUS | Status: AC
  Filled 2019-01-05: qty 10

## 2019-01-05 MED ORDER — BUPIVACAINE HCL (PF) 0.25 % IJ SOLN
INTRAMUSCULAR | Status: AC
  Filled 2019-01-05: qty 30

## 2019-01-05 SURGICAL SUPPLY — 56 items
ADH SKN CLS APL DERMABOND .7 (GAUZE/BANDAGES/DRESSINGS) ×1
APL SKNCLS STERI-STRIP NONHPOA (GAUZE/BANDAGES/DRESSINGS) ×1
APPLIER CLIP LOGIC TI 5 (MISCELLANEOUS) ×2 IMPLANT
APPLIER CLIP ROT 10 11.4 M/L (STAPLE)
APR CLP MED LRG 11.4X10 (STAPLE)
APR CLP MED LRG 33X5 (MISCELLANEOUS) ×1
BENZOIN TINCTURE PRP APPL 2/3 (GAUZE/BANDAGES/DRESSINGS) ×3 IMPLANT
BINDER ABDOMINAL 12 ML 46-62 (SOFTGOODS) ×3 IMPLANT
BLADE CLIPPER SURG (BLADE) IMPLANT
CANISTER SUCT 3000ML PPV (MISCELLANEOUS) IMPLANT
CLIP APPLIE ROT 10 11.4 M/L (STAPLE) IMPLANT
COVER SURGICAL LIGHT HANDLE (MISCELLANEOUS) ×3 IMPLANT
COVER WAND RF STERILE (DRAPES) ×3 IMPLANT
DERMABOND ADVANCED (GAUZE/BANDAGES/DRESSINGS) ×2
DERMABOND ADVANCED .7 DNX12 (GAUZE/BANDAGES/DRESSINGS) ×1 IMPLANT
DEVICE SECURE STRAP 25 ABSORB (INSTRUMENTS) ×5 IMPLANT
DEVICE TROCAR PUNCTURE CLOSURE (ENDOMECHANICALS) ×3 IMPLANT
DRAPE INCISE IOBAN 66X45 STRL (DRAPES) ×3 IMPLANT
DRAPE LAPAROSCOPIC ABDOMINAL (DRAPES) ×3 IMPLANT
ELECT REM PT RETURN 9FT ADLT (ELECTROSURGICAL) ×3
ELECTRODE REM PT RTRN 9FT ADLT (ELECTROSURGICAL) ×1 IMPLANT
GAUZE SPONGE 4X4 12PLY STRL (GAUZE/BANDAGES/DRESSINGS) ×2 IMPLANT
GLOVE SURG SIGNA 7.5 PF LTX (GLOVE) ×3 IMPLANT
GOWN STRL REUS W/ TWL LRG LVL3 (GOWN DISPOSABLE) ×2 IMPLANT
GOWN STRL REUS W/ TWL XL LVL3 (GOWN DISPOSABLE) ×1 IMPLANT
GOWN STRL REUS W/TWL LRG LVL3 (GOWN DISPOSABLE) ×6
GOWN STRL REUS W/TWL XL LVL3 (GOWN DISPOSABLE) ×3
KIT BASIN OR (CUSTOM PROCEDURE TRAY) ×3 IMPLANT
KIT TURNOVER KIT B (KITS) ×3 IMPLANT
MARKER SKIN DUAL TIP RULER LAB (MISCELLANEOUS) ×3 IMPLANT
MESH PARIETEX 20X15 (Mesh General) ×2 IMPLANT
NDL SPNL 22GX3.5 QUINCKE BK (NEEDLE) ×1 IMPLANT
NEEDLE SPNL 22GX3.5 QUINCKE BK (NEEDLE) ×3 IMPLANT
NS IRRIG 1000ML POUR BTL (IV SOLUTION) ×3 IMPLANT
PAD ARMBOARD 7.5X6 YLW CONV (MISCELLANEOUS) ×6 IMPLANT
PENCIL BUTTON BLDE SNGL 10FT (ELECTRODE) ×2 IMPLANT
SCISSORS LAP 5X35 DISP (ENDOMECHANICALS) IMPLANT
SET IRRIG TUBING LAPAROSCOPIC (IRRIGATION / IRRIGATOR) IMPLANT
SET TUBE SMOKE EVAC HIGH FLOW (TUBING) ×3 IMPLANT
SHEARS HARMONIC ACE PLUS 36CM (ENDOMECHANICALS) ×3 IMPLANT
SLEEVE ENDOPATH XCEL 5M (ENDOMECHANICALS) ×7 IMPLANT
SUT MNCRL AB 4-0 PS2 18 (SUTURE) ×3 IMPLANT
SUT NOVA 0 T19/GS 22DT (SUTURE) ×11 IMPLANT
SUT VIC AB 3-0 SH 18 (SUTURE) ×2 IMPLANT
TOWEL GREEN STERILE (TOWEL DISPOSABLE) ×3 IMPLANT
TOWEL GREEN STERILE FF (TOWEL DISPOSABLE) ×3 IMPLANT
TRAY FOLEY W/BAG SLVR 16FR (SET/KITS/TRAYS/PACK)
TRAY FOLEY W/BAG SLVR 16FR ST (SET/KITS/TRAYS/PACK) IMPLANT
TRAY LAPAROSCOPIC MC (CUSTOM PROCEDURE TRAY) ×3 IMPLANT
TROCAR XCEL BLUNT TIP 100MML (ENDOMECHANICALS) IMPLANT
TROCAR XCEL NON-BLD 11X100MML (ENDOMECHANICALS) IMPLANT
TROCAR XCEL NON-BLD 5MMX100MML (ENDOMECHANICALS) ×3 IMPLANT
TUBE CONNECTING 12'X1/4 (SUCTIONS) ×1
TUBE CONNECTING 12X1/4 (SUCTIONS) ×1 IMPLANT
WATER STERILE IRR 1000ML POUR (IV SOLUTION) ×3 IMPLANT
YANKAUER SUCT BULB TIP NO VENT (SUCTIONS) ×2 IMPLANT

## 2019-01-05 NOTE — Anesthesia Postprocedure Evaluation (Signed)
Anesthesia Post Note  Patient: Jodi Jimenez  Procedure(s) Performed: LAPAROSCOPIC VENTRAL INCISIONAL HERNIA WITH MESH (N/A Abdomen)     Patient location during evaluation: PACU Anesthesia Type: General Level of consciousness: awake and alert, oriented and patient cooperative Pain management: pain level controlled Vital Signs Assessment: post-procedure vital signs reviewed and stable Respiratory status: spontaneous breathing, nonlabored ventilation and respiratory function stable Cardiovascular status: blood pressure returned to baseline and stable Postop Assessment: no apparent nausea or vomiting Anesthetic complications: no    Last Vitals:  Vitals:   01/05/19 1035 01/05/19 1050  BP: 97/66 120/67  Pulse: 60 61  Resp: 13 11  Temp:  36.6 C  SpO2: 93% 93%    Last Pain:  Vitals:   01/05/19 1050  TempSrc:   PainSc: 4                  Gordon Vandunk,E. Daleyssa Loiselle

## 2019-01-05 NOTE — Transfer of Care (Signed)
Immediate Anesthesia Transfer of Care Note  Patient: Jodi Jimenez  Procedure(s) Performed: LAPAROSCOPIC VENTRAL INCISIONAL HERNIA WITH MESH (N/A Abdomen)  Patient Location: PACU  Anesthesia Type:General  Level of Consciousness: awake, alert  and oriented  Airway & Oxygen Therapy: Patient Spontanous Breathing and Patient connected to nasal cannula oxygen  Post-op Assessment: Report given to RN, Post -op Vital signs reviewed and stable and Patient moving all extremities  Post vital signs: Reviewed and stable  Last Vitals:  Vitals Value Taken Time  BP 116/63 01/05/19 0952  Temp 36.3 C 01/05/19 0950  Pulse 62 01/05/19 0954  Resp 13 01/05/19 0954  SpO2 97 % 01/05/19 0954  Vitals shown include unvalidated device data.  Last Pain:  Vitals:   01/05/19 0950  TempSrc:   PainSc: 9          Complications: No apparent anesthesia complications

## 2019-01-05 NOTE — Interval H&P Note (Signed)
History and Physical Interval Note:  01/05/2019 7:21 AM  Jodi Jimenez  has presented today for surgery, with the diagnosis of VENTRAL INCISIONAL HERNIA.  The various methods of treatment have been discussed with the patient and family.   To contact husband after surgery.  After consideration of risks, benefits and other options for treatment, the patient has consented to  Procedure(s): [place] (N/A) as a surgical intervention.  The patient's history has been reviewed, patient examined, no change in status, stable for surgery.  I have reviewed the patient's chart and labs.  Questions were answered to the patient's satisfaction.     Shann Medal

## 2019-01-05 NOTE — Anesthesia Procedure Notes (Addendum)
Procedure Name: Intubation Date/Time: 01/05/2019 7:38 AM Performed by: Amadeo Garnet, CRNA Pre-anesthesia Checklist: Patient identified, Emergency Drugs available, Suction available, Patient being monitored and Timeout performed Patient Re-evaluated:Patient Re-evaluated prior to induction Oxygen Delivery Method: Circle system utilized Preoxygenation: Pre-oxygenation with 100% oxygen Induction Type: IV induction Ventilation: Mask ventilation without difficulty Laryngoscope Size: Mac and 3 Grade View: Grade I Tube type: Oral Tube size: 7.0 mm Number of attempts: 1 Airway Equipment and Method: Stylet Placement Confirmation: ETT inserted through vocal cords under direct vision,  positive ETCO2 and breath sounds checked- equal and bilateral Secured at: 21 cm Tube secured with: Tape Dental Injury: Teeth and Oropharynx as per pre-operative assessment

## 2019-01-05 NOTE — Progress Notes (Signed)
Pt arrived to 6N26 via stretcher from pacu, Pt is AOx4, VSS, complaints of nausea, PRN meds given for nausea. Oriented to room, phone, call light and use of bed controls. Will continue to monitor.

## 2019-01-05 NOTE — Plan of Care (Signed)
  Problem: Clinical Measurements: Goal: Ability to maintain clinical measurements within normal limits will improve Outcome: Progressing Goal: Postoperative complications will be avoided or minimized Outcome: Progressing   

## 2019-01-05 NOTE — Op Note (Addendum)
OPERATIVE NOTE  01/05/2019  9:36 AM  PATIENT:  Jodi Jimenez, 58 y.o., female, MRN: [id number]  PREOP DIAGNOSIS:  Incarcerated VENTRAL INCISIONAL HERNIA  POSTOP DIAGNOSIS:   Incarcerated VENTRAL INCISIONAL HERNIA  PROCEDURE:   Procedure(s): LAPAROSCOPIC VENTRAL INCISIONAL HERNIA WITH MESH  SURGEON:   Alphonsa Overall, M.D.  ASSISTANT:   none  ANESTHESIA:   general  Anesthesiologist: Annye Asa, MD CRNA: Amadeo Garnet, CRNA; Wilburn Cornelia, CRNA  General  EBL:  50  ml  BLOOD ADMINISTERED: none  DRAINS: none   LOCAL MEDICATIONS USED:   20 cc of Exparel and 30 cc of 1/4% marcaine  SPECIMEN:   None  COUNTS CORRECT:  YES  INDICATIONS FOR PROCEDURE:  Jodi Jimenez is a 58 y.o. (DOB: 1961-01-14) white female whose primary care physician is Celene Squibb, MD and comes for repair of ventral incisional hernia   The indications and risks of the surgery were explained to the patient.  The risks include, but are not limited to, infection, bleeding, and nerve injury.  OPERATIVE NOTE:  The patient was taken to room 2 at [place].  He underwent a general anesthesia.  He was given 2 grams of Ancef at the beginning of the operation.   A time out was held and the surgical checklist run.   The abdomen was prepped with Cholroprep and sterilely draped.  I covered the abdomen with a Ioban drape.   I accessed the LUQ with a 5 mm trocar.  An additional 40mm trocar was placed in the left mid abdomen and a 5 mm trocar in the right upper abdomen.   She had a 4 cm x 5 cm defect just above the umbilicus.  There was omentum incarcerated in the defect.  I spent 20 minutes reducing the omentum.   I made an directly over the hernia and excised the hernia sac.  It appeared benign and I did not send it to pathology.  She also had a small (1.0 cm) umbilical defect that I included in my closure.  I closed the fascia with interrupted 0 Novafil sutures.  Prior to closing the defect I introduce  a 15 cm by 10 cm coated Parietex into the abdominal cavity.  It had 8 holding sutures of 0 Novafil that I captured transfascially with an endoclose..  These were tied down to secure the mesh to the anterior abdominal wall.  I then used 46 Securestrap tacks to tack the mesh to the anterior abdominal wall.  I placed two additional transfascial sutures lateral to the the fascial closure.  The fascial closure was about 6 cm transversely.   The abdomen was deflated.  The mesh was inspected and there were no defects around the edge.  At the end of the case, I placed a TAP block with the local anesthetic bilaterally.  I also put local around the trocar site.   The trocars were then removed.  There was no bleeding at the trocar sites.  The incisions were closed with 4-0 Monocryl and painted with DermaBond. The puncture sites were painted with DermaBond.   The sponge and needle count were correct.  The patient had an abdominal binder placed.  The patient was transferred to the recovery room in good condition.  Type of repair - Primary repair and mesh repair  (choices - primary suture, mesh, or component)  Name of mesh - Parietex  Size of mesh - Length 20 cm, Width 15 cm  Mesh overlap -  5 cm  Placement of mesh - beneath fascia and into peritoneal cavity  (choices - beneath fascia and into peritoneal cavity, beneath fascia but external to peritoneal cavity, between the muscle and fascia, above or external to fascia)   20 x 15 cm Parietex mesh (view from LUQ to RLQ)   Fascial defect closure about 6 cm    Alphonsa Overall, MD, [facility] Surgery Pager: [phone] Office phone:  [phone]

## 2019-01-06 ENCOUNTER — Encounter (HOSPITAL_COMMUNITY): Payer: Self-pay | Admitting: Surgery

## 2019-01-06 DIAGNOSIS — K430 Incisional hernia with obstruction, without gangrene: Secondary | ICD-10-CM | POA: Diagnosis not present

## 2019-01-06 MED ORDER — TRAMADOL HCL 50 MG PO TABS: 50.0000 mg | ORAL_TABLET | Freq: Four times a day (QID) | ORAL | 0 refills | Status: DC | PRN

## 2019-01-06 NOTE — Discharge Instructions (Signed)
[facility] - DISCHARGE INSTRUCTIONS TO PATIENT  Work:  Will probably be out of work for one month, until 02/04/2019.  Will discuss this on follow up.  Activity:  Driving - May drive in 4 to 6 days, if doing well and off pain meds   Lifting - No lifting more than 15 pounds for 4 weeks.                       Practice you Covid-19 protection:  Wear a mask, social distance, and wash your hands frequently  Wound Care:   Leave the incision dry until tomorrow, then you may shower.       Wear the abdominal binder while out of bed for one month.  It is also okay to wear it in bed, but not necessary.  Diet:  As tolerated  Follow up appointment:  Call Dr. Pollie Friar office [facility]) at [phone] for an appointment in 2 to 3 weeks..  Medications and dosages:  Resume your home medications.  You have a prescription for:  Ultram  Call Dr. Lucia Gaskins or his office  [phone]) if you have:  Temperature greater than 100.4,  Persistent nausea and vomiting,  Severe uncontrolled pain,  Redness, tenderness, or signs of infection (pain, swelling, redness, odor or green/yellow discharge around the site),  Difficulty breathing, headache or visual disturbances,  Any other questions or concerns you may have after discharge.  In an emergency, call 911 or go to an Emergency Department at a nearby hospital.

## 2019-01-06 NOTE — Discharge Summary (Signed)
Physician Discharge Summary  Patient ID:  Jodi Jimenez  MRN: [id number]  DOB/AGE: 1960/11/02 58 y.o.  Admit date: 01/05/2019 Discharge date: 01/06/2019  Discharge Diagnoses:  1.  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  BREAST MASS, RIGHT (N63.10)             Right breast biopsy - 830/2019 - Pathology [id number]) shows complex sclerosing lesion, lobular neoplasia (LCIS). 3.  Right adrenal mass             MRI of abdomen - 12/29/2018 - 1. The 1.5 by 1.3 cm right adrenal mass demonstrates mild signal dropout on out of phase images compared to in phase images. This favors lipid poor adenoma, although the dropout of signal from a subjective visual standpoint is only subtle. I would suggest a follow up noncontrast CT of the upper abdomen in 1 years time to assess for stability.                         2. Diffuse hepatic steatosis.  4. Robotic sacrocolpopexy on 03/12/2016 - [facility] 5. HTN 6. Skin splotches that changes color with her temperature change   Active Problems:   Incarcerated ventral hernia  Operation: Procedure(s):  LAPAROSCOPIC VENTRAL INCISIONAL HERNIA WITH MESH on 01/05/2019 Lucia Gaskins  Discharged Condition: good  Hospital Course: Jodi Jimenez is an 58 y.o. female whose primary care physician is Celene Squibb, MD and who was admitted 01/05/2019 with a chief complaint of ventral incisional hernai.   She was brought to the operating room on 01/05/2019 and underwent [place].   The discharge instructions were reviewed with the patient.  Consults: None  Significant Diagnostic Studies: Results for orders placed or performed during the hospital encounter of 01/03/19  SARS CORONAVIRUS 2 (TAT 6-24 HRS) Nasopharyngeal Nasopharyngeal Swab   Specimen: Nasopharyngeal Swab  Result Value Ref Range   SARS Coronavirus 2 NEGATIVE NEGATIVE    Mr Abdomen Wwo Contrast  Addendum Date: 12/30/2018    ADDENDUM REPORT: 12/30/2018 09:39 ADDENDUM: The original report was by Dr. Van Clines. The following addendum is by Dr. Van Clines: Today's exam also partially includes the patient's known supraumbilical hernia containing adipose tissue on some axial sequences such as image 33 of series 5. This was shown in its entirety on the CT scan from 11/25/2018. Electronically Signed   By: Van Clines M.D.   On: 12/30/2018 09:39   Result Date: 12/30/2018 CLINICAL DATA:  Nonspecific right adrenal mass, for further characterization. EXAM: MRI ABDOMEN WITHOUT AND WITH CONTRAST TECHNIQUE: Multiplanar multisequence MR imaging of the abdomen was performed both before and after the administration of intravenous contrast. CONTRAST:  27mL MULTIHANCE GADOBENATE DIMEGLUMINE 529 MG/ML IV SOLN COMPARISON:  11/25/2018 FINDINGS: Lower chest: Unremarkable Hepatobiliary: Diffuse steatosis in the visualized portion of the liver. Cholecystectomy. Pancreas:  Unremarkable Spleen:  Unremarkable Adrenals/Urinary Tract: 1.5 by 1.3 cm right adrenal mass with in-phase signal intensity of 220 and out-of-phase signal intensity of 130, compatible with mild signal dropout on the out of phase images favoring lipid poor adenoma. No T2 signal hyperintensity of the lesion. Stomach/Bowel: Unremarkable Vascular/Lymphatic:  Unremarkable Other:  No supplemental non-categorized findings. Musculoskeletal: Unremarkable IMPRESSION: 1. The 1.5 by 1.3 cm right adrenal mass demonstrates mild signal dropout on out of phase images compared to in phase images. This favors lipid poor adenoma, although the dropout  of signal from a subjective visual standpoint is only subtle. I would suggest a follow up noncontrast CT of the upper abdomen in 1 years time to assess for stability. 2. Diffuse hepatic steatosis. Electronically Signed: By: Van Clines M.D. On: 12/30/2018 09:00    Discharge Exam:  Vitals:   01/05/19 2037 01/06/19 0457  BP: (!)  134/99 121/65  Pulse: 80 61  Resp: 18 18  Temp: 97.9 F (36.6 C) 97.7 F (36.5 C)  SpO2: 94% 99%    General: WN obese WF who is alert and generally healthy appearing.  Lungs: Clear to auscultation and symmetric breath sounds. Heart:  RRR. No murmur or rub. Abdomen: Soft. No mass. Normal bowel sounds. Sore.  Incsions look good.  Discharge Medications:   Allergies as of 01/06/2019      Reactions   Codeine Nausea And Vomiting      Medication List    TAKE these medications   acetaminophen 500 MG tablet Commonly known as: TYLENOL Take 1,000 mg by mouth every 6 (six) hours as needed for moderate pain.   Black Cohosh 80 MG Caps Take 320 mg by mouth daily.   estradiol 0.5 MG tablet Commonly known as: ESTRACE Take 1 tablet (0.5 mg total) by mouth daily.   Evening Primrose Oil 1000 MG Caps Take 1,000 mg by mouth daily.   lisinopril-hydrochlorothiazide 20-25 MG tablet Commonly known as: ZESTORETIC Take 1 tablet by mouth daily.   traMADol 50 MG tablet Commonly known as: Ultram Take 1 tablet (50 mg total) by mouth every 6 (six) hours as needed for moderate pain or severe pain.       Disposition: Discharge disposition: 01-Home or Self Care       Discharge Instructions    Diet - low sodium heart healthy   Complete by: As directed    Increase activity slowly   Complete by: As directed          Signed: Alphonsa Overall, M.D., [facility] Surgery Office:  [phone]  01/06/2019, 8:10 AM

## 2019-01-24 ENCOUNTER — Encounter: Payer: Self-pay | Admitting: Gynecology

## 2019-02-02 ENCOUNTER — Encounter: Payer: Self-pay | Admitting: Obstetrics & Gynecology

## 2019-06-09 ENCOUNTER — Other Ambulatory Visit: Payer: Self-pay

## 2019-06-09 ENCOUNTER — Emergency Department (HOSPITAL_COMMUNITY)
Admission: EM | Admit: 2019-06-09 | Discharge: 2019-06-09 | Disposition: A | Discharge status: Home / Self Care | Payer: BC Managed Care – PPO | Attending: Emergency Medicine | Admitting: Emergency Medicine

## 2019-06-09 ENCOUNTER — Emergency Department (HOSPITAL_COMMUNITY): Payer: BC Managed Care – PPO

## 2019-06-09 ENCOUNTER — Encounter (HOSPITAL_COMMUNITY): Payer: Self-pay

## 2019-06-09 DIAGNOSIS — R1011 Right upper quadrant pain: Secondary | ICD-10-CM | POA: Insufficient documentation

## 2019-06-09 DIAGNOSIS — U071 COVID-19: Secondary | ICD-10-CM | POA: Diagnosis present

## 2019-06-09 DIAGNOSIS — Z79899 Other long term (current) drug therapy: Secondary | ICD-10-CM | POA: Diagnosis not present

## 2019-06-09 DIAGNOSIS — I10 Essential (primary) hypertension: Secondary | ICD-10-CM | POA: Insufficient documentation

## 2019-06-09 DIAGNOSIS — R1031 Right lower quadrant pain: Secondary | ICD-10-CM | POA: Diagnosis not present

## 2019-06-09 DIAGNOSIS — R109 Unspecified abdominal pain: Secondary | ICD-10-CM

## 2019-06-09 LAB — CBC WITH DIFFERENTIAL/PLATELET
Abs Immature Granulocytes: 0.02 10*3/uL (ref 0.00–0.07)
Basophils Absolute: 0 10*3/uL (ref 0.0–0.1)
Basophils Relative: 0 %
Eosinophils Absolute: 0 10*3/uL (ref 0.0–0.5)
Eosinophils Relative: 1 %
HCT: 42.9 % (ref 36.0–46.0)
Hemoglobin: 14.3 g/dL (ref 12.0–15.0)
Immature Granulocytes: 0 %
Lymphocytes Relative: 18 %
Lymphs Abs: 1.2 10*3/uL (ref 0.7–4.0)
MCH: 31.6 pg (ref 26.0–34.0)
MCHC: 33.3 g/dL (ref 30.0–36.0)
MCV: 94.9 fL (ref 80.0–100.0)
Monocytes Absolute: 0.5 10*3/uL (ref 0.1–1.0)
Monocytes Relative: 8 %
Neutro Abs: 4.8 10*3/uL (ref 1.7–7.7)
Neutrophils Relative %: 73 %
Platelets: 172 10*3/uL (ref 150–400)
RBC: 4.52 MIL/uL (ref 3.87–5.11)
RDW: 12.6 % (ref 11.5–15.5)
WBC: 6.6 10*3/uL (ref 4.0–10.5)
nRBC: 0 % (ref 0.0–0.2)

## 2019-06-09 LAB — COMPREHENSIVE METABOLIC PANEL
ALT: 18 U/L (ref 0–44)
AST: 31 U/L (ref 15–41)
Albumin: 3.4 g/dL — ABNORMAL LOW (ref 3.5–5.0)
Alkaline Phosphatase: 54 U/L (ref 38–126)
Anion gap: 13 (ref 5–15)
BUN: 18 mg/dL (ref 6–20)
CO2: 26 mmol/L (ref 22–32)
Calcium: 8.2 mg/dL — ABNORMAL LOW (ref 8.9–10.3)
Chloride: 95 mmol/L — ABNORMAL LOW (ref 98–111)
Creatinine, Ser: 0.96 mg/dL (ref 0.44–1.00)
GFR calc Af Amer: >60 mL/min (ref >60)
GFR calc non Af Amer: >60 mL/min (ref >60)
Glucose, Bld: 105 mg/dL — ABNORMAL HIGH (ref 70–99)
Potassium: 3.7 mmol/L (ref 3.5–5.1)
Sodium: 134 mmol/L — ABNORMAL LOW (ref 135–145)
Total Bilirubin: 0.9 mg/dL (ref 0.3–1.2)
Total Protein: 7.1 g/dL (ref 6.5–8.1)

## 2019-06-09 LAB — URINALYSIS, ROUTINE W REFLEX MICROSCOPIC
Bacteria, UA: NONE SEEN
Bilirubin Urine: NEGATIVE
Glucose, UA: NEGATIVE mg/dL
Hgb urine dipstick: NEGATIVE
Ketones, ur: NEGATIVE mg/dL
Nitrite: NEGATIVE
Protein, ur: NEGATIVE mg/dL
Specific Gravity, Urine: 1.005 (ref 1.005–1.030)
pH: 6 (ref 5.0–8.0)

## 2019-06-09 LAB — LIPASE, BLOOD: Lipase: 38 U/L (ref 11–51)

## 2019-06-09 MED ORDER — IOHEXOL 300 MG/ML  SOLN
100.0000 mL | Freq: Once | INTRAMUSCULAR | Status: AC | PRN
  Administered 2019-06-09: 14:00:00 100 mL via INTRAVENOUS

## 2019-06-09 MED ORDER — SODIUM CHLORIDE 0.9 % IV BOLUS
500.0000 mL | Freq: Once | INTRAVENOUS | Status: AC
  Administered 2019-06-09: 12:00:00 500 mL via INTRAVENOUS

## 2019-06-09 NOTE — Discharge Instructions (Addendum)
Continue to isolate with your Covid infection.  Return for worsening shortness of breath or new concerns.  Take Tylenol and Motrin as needed for pain.

## 2019-06-09 NOTE — ED Provider Notes (Signed)
[facility] Provider Note   CSN: [id number] Arrival date & time: 06/09/19  1007     History Chief Complaint  Patient presents with  . Abdominal Pain    Jodi Jimenez is a 59 y.o. female.  Patient presents with worsening abdominal pain for 2 weeks.  Initially was intermittent right mid and upper abdomen.  Patient had a gallbladder removed in the past.  No vomiting however nauseous.  Patient tested positive for Covid last Wednesday and denies any significant cough or shortness of breath.  Patient denies blood in the stools.  Patient still has her appendix.  Pain constant the past 24 hours.        Past Medical History:  Diagnosis Date  . Abnormal tilt table test    syncope  . Breast mass, right   . GERD (gastroesophageal reflux disease)    resolved   . Helicobacter pylori gastritis 12/19/2012   AUG 2014 EGD-ABO BID FOR 10 DAYS   . HTN (hypertension)   . PONV (postoperative nausea and vomiting)   . Red birthmarks    left hand and forearm  . Syncope and collapse    Dr. Jens Som, tilt table test+    Patient Active Problem List   Diagnosis Date Noted  . Incarcerated ventral hernia 01/05/2019  . Postoperative state 01/23/2015  . GERD (gastroesophageal reflux disease) 02/15/2013  . Helicobacter pylori gastritis 12/19/2012  . Rectal bleeding 12/08/2012  . Diarrhea 12/08/2012  . Change in bowel function 12/08/2012  . Syncope 01/23/2011  . HTN (hypertension) 01/23/2011  . ELBOW PAIN 01/21/2009  . LATERAL EPICONDYLITIS 01/21/2009    Past Surgical History:  Procedure Laterality Date  . ABDOMINAL HYSTERECTOMY     vaginal with ovarian preservation   . BREAST LUMPECTOMY WITH RADIOACTIVE SEED LOCALIZATION Right 12/24/2017   Procedure: BREAST LUMPECTOMY WITH RADIOACTIVE SEED LOCALIZATION;  Surgeon: Alphonsa Overall, MD;  Location: [facility];  Service: General;  Laterality: Right;  . CHOLECYSTECTOMY    . COLONOSCOPY N/A 12/13/2012   Procedure:  COLONOSCOPY;  Surgeon: Danie Binder, MD;  [facility];  Service: Endoscopy;  Laterality: N/A;  10:45  . CYSTOCELE REPAIR N/A 01/23/2015   Procedure: ANTERIOR REPAIR (CYSTOCELE);  Surgeon: Princess Bruins, MD;  Location: [facility];  Service: Gynecology;  Laterality: N/A;  . ELBOW ARTHROSCOPY Right    repair torn ligament  . ESOPHAGOGASTRODUODENOSCOPY N/A 12/13/2012   Procedure: ESOPHAGOGASTRODUODENOSCOPY (EGD);  Surgeon: Danie Binder, MD;  [facility];  Service: Endoscopy;  Laterality: N/A;  . LAPAROSCOPIC ASSISTED VENTRAL HERNIA REPAIR  01/05/2019  . RECTOCELE REPAIR  2006  . ROBOTIC ASSISTED LAPAROSCOPIC SACROCOLPOPEXY N/A 03/12/2016   Procedure: ROBOTIC ASSISTED LAPAROSCOPIC SACROCOLPOPEXY with Carrolyn Meiers;  Surgeon: Princess Bruins, MD;  Location: [facility];  Service: Gynecology;  Laterality: N/A;  Requests 2 1/2 hrs. REQUEST TINA RNFA  . take down of bladder sling  2006  . tilt table test positive     heart rate "flatlined"  . TUBAL LIGATION    . VAGINAL HYSTERECTOMY  2006   with bladder sling  . VENTRAL HERNIA REPAIR N/A 01/05/2019   Procedure: LAPAROSCOPIC VENTRAL INCISIONAL HERNIA WITH MESH;  Surgeon: Alphonsa Overall, MD;  Location: [facility];  Service: General;  Laterality: N/A;     OB History    Gravida  3   Para  3   Term      Preterm      AB      Living  3  SAB      TAB      Ectopic      Multiple      Live Births              Family History  Problem Relation Age of Onset  . Heart failure Other        x3 maternal side  . Heart attack Other   . Diabetes Other   . Heart disease Other        paternal side  . Diabetes Mother   . Hypertension Mother   . Hypertension Father   . Diabetes Maternal Grandmother   . Hypertension Maternal Grandmother   . Diabetes Maternal Grandfather   . Hypertension Maternal Grandfather   . Hypertension Paternal Grandmother   . Hypertension Paternal Grandfather   . Colon cancer Neg Hx     Social  History   Tobacco Use  . Smoking status: Never Smoker  . Smokeless tobacco: Never Used  Substance Use Topics  . Alcohol use: Yes    Comment: occ  . Drug use: No    Home Medications Prior to Admission medications   Medication Sig Start Date End Date Taking? Authorizing Provider  acetaminophen (TYLENOL) 500 MG tablet Take 1,000 mg by mouth every 6 (six) hours as needed for moderate pain.    Yes [provider]  Black Cohosh 80 MG CAPS Take 320 mg by mouth daily.   Yes [provider]  Evening Primrose Oil 1000 MG CAPS Take 1,000 mg by mouth daily.   Yes [provider]  lisinopril-hydrochlorothiazide (PRINZIDE,ZESTORETIC) 20-25 MG tablet Take 1 tablet by mouth daily.   Yes [provider]  ondansetron (ZOFRAN-ODT) 8 MG disintegrating tablet Place 8 mg inside cheek every 8 (eight) hours as needed. 06/07/19  Yes [provider]  predniSONE (DELTASONE) 20 MG tablet Take 40 mg by mouth daily. 06/08/19  Yes [provider]  promethazine (PHENERGAN) 25 MG tablet Take 25 mg by mouth every 6 (six) hours as needed. 06/08/19  Yes [provider]  estradiol (ESTRACE) 0.5 MG tablet Take 1 tablet (0.5 mg total) by mouth daily. Patient not taking: Reported on 12/26/2018 07/30/17   Princess Bruins, MD    Allergies    Codeine  Review of Systems   Review of Systems  Constitutional: Negative for chills and fever.  HENT: Negative for congestion.   Eyes: Negative for visual disturbance.  Respiratory: Negative for shortness of breath.   Cardiovascular: Negative for chest pain.  Gastrointestinal: Positive for abdominal pain and nausea. Negative for vomiting.  Genitourinary: Negative for dysuria and flank pain.  Musculoskeletal: Negative for back pain, neck pain and neck stiffness.  Skin: Negative for rash.  Neurological: Negative for light-headedness and headaches.    Physical Exam Updated Vital Signs BP (!) 142/77   Pulse 79   Temp  98.3 F (36.8 C)   Resp 20   Ht 5\' 2"  (1.575 m)   Wt 86.6 kg   SpO2 93%   BMI 34.93 kg/m   Physical Exam Vitals and nursing note reviewed.  Constitutional:      Appearance: She is well-developed.  HENT:     Head: Normocephalic and atraumatic.  Eyes:     General:        Right eye: No discharge.        Left eye: No discharge.     Conjunctiva/sclera: Conjunctivae normal.  Neck:     Trachea: No tracheal deviation.  Cardiovascular:  Rate and Rhythm: Normal rate and regular rhythm.  Pulmonary:     Effort: Pulmonary effort is normal.     Breath sounds: Normal breath sounds.  Abdominal:     General: There is no distension.     Palpations: Abdomen is soft.     Tenderness: There is abdominal tenderness (right abdomen). There is no guarding.  Musculoskeletal:     Cervical back: Normal range of motion and neck supple.  Skin:    General: Skin is warm.     Findings: No rash.  Neurological:     Mental Status: She is alert and oriented to person, place, and time.     ED Results / Procedures / Treatments   Labs (all labs ordered are listed, but only abnormal results are displayed) Labs Reviewed  COMPREHENSIVE METABOLIC PANEL - Abnormal; Notable for the following components:      Result Value   Sodium 134 (*)    Chloride 95 (*)    Glucose, Bld 105 (*)    Calcium 8.2 (*)    Albumin 3.4 (*)    All other components within normal limits  URINALYSIS, ROUTINE W REFLEX MICROSCOPIC - Abnormal; Notable for the following components:   Leukocytes,Ua TRACE (*)    All other components within normal limits  LIPASE, BLOOD  CBC WITH DIFFERENTIAL/PLATELET    EKG None  Radiology CT ABDOMEN PELVIS W CONTRAST  Result Date: 06/09/2019 CLINICAL DATA:  Right upper quadrant pain for 1 month. The patient reports a positive COVID-19 test 9 days ago. EXAM: CT ABDOMEN AND PELVIS WITH CONTRAST TECHNIQUE: Multidetector CT imaging of the abdomen and pelvis was performed using the standard  protocol following bolus administration of intravenous contrast. CONTRAST:  100 mL OMNIPAQUE IOHEXOL 300 MG/ML  SOLN COMPARISON:  CT abdomen and pelvis 11/25/2018. FINDINGS: Lower chest: There is ground-glass attenuating airspace disease in the right middle lobe, lingula and both lower lobes. Opacities are most dense in the right lower lobe. No pleural or pericardial effusion. Calcific coronary artery disease noted. Hepatobiliary: No focal liver abnormality is seen. Status post cholecystectomy. No biliary dilatation. Pancreas: Unremarkable. No pancreatic ductal dilatation or surrounding inflammatory changes. Spleen: Normal in size without focal abnormality. Adrenals/Urinary Tract: Adrenal glands are unremarkable. Kidneys are normal, without renal calculi, worrisome focal lesion, or hydronephrosis. A few small parapelvic cysts are seen in both kidneys. Bladder is unremarkable. Stomach/Bowel: Stomach is within normal limits. Appendix appears normal. No evidence of bowel wall thickening, distention, or inflammatory changes. Scattered diverticulosis is most notable in the sigmoid colon. Vascular/Lymphatic: Aortic atherosclerosis. No enlarged abdominal or pelvic lymph nodes. Reproductive: Status post hysterectomy. No adnexal masses. Other: None. Musculoskeletal: No acute or focal abnormality. Right hip osteoarthritis noted. IMPRESSION: Airspace disease in the lingula, right middle lobe and lower lobes bilaterally has an appearance consistent with pneumonia and is typical of COVID-19 infection. No acute abnormality abdomen or pelvis. Diverticulosis without diverticulitis. Calcific and aortic coronary atherosclerosis. Electronically Signed   By: Inge Rise M.D.   On: 06/09/2019 14:45    Procedures Procedures (including critical care time)  Medications Ordered in ED Medications  sodium chloride 0.9 % bolus 500 mL (0 mLs Intravenous Stopped 06/09/19 1234)  iohexol (OMNIPAQUE) 300 MG/ML solution 100 mL (100 mLs  Intravenous Contrast Given 06/09/19 1413)    ED Course  I have reviewed the triage vital signs and the nursing notes.  Pertinent labs & imaging results that were available during my care of the patient were reviewed by me and  considered in my medical decision making (see chart for details).    MDM Rules/Calculators/A&P                     Patient with recent diagnosis of Covid presents with constant right-sided abdominal pain that was initially intermittent.  Patient does not have her gallbladder any longer however has had hernia issues in the past.  With worsening symptoms plan for CT scan for further delineation.  Blood work ordered normal white blood cell count, normal hemoglobin, normal kidney function.  Urinalysis reviewed no sign of significant infection.  CT scan pending.  Patient does not want pain meds at this time.  Delay in CT scan, CT scan results reviewed no acute findings and only significant abnormalities consistent with Covid pneumonia for which she was diagnosed with last week.  Patient not requiring oxygen, no respiratory difficulty at this time.  Jodi Jimenez was evaluated in Emergency Department on 06/09/2019 for the symptoms described in the history of present illness. She was evaluated in the context of the global COVID-19 pandemic, which necessitated consideration that the patient might be at risk for infection with the SARS-CoV-2 virus that causes COVID-19. Institutional protocols and algorithms that pertain to the evaluation of patients at risk for COVID-19 are in a state of rapid change based on information released by regulatory bodies including the CDC and federal and state organizations. These policies and algorithms were followed during the patient's care in the ED.  Final Clinical Impression(s) / ED Diagnoses Final diagnoses:  Acute abdominal pain  COVID-19    Rx / DC Orders ED Discharge Orders    None       Elnora Morrison, MD 06/09/19 1458

## 2019-06-09 NOTE — ED Triage Notes (Signed)
Pt reports RUQ pain x 2 weeks.  Reports nausea, no vomiting.  Reports tested positive for covid last Wednesday.

## 2020-03-23 ENCOUNTER — Ambulatory Visit
Admission: EM | Admit: 2020-03-23 | Discharge: 2020-03-23 | Disposition: A | Discharge status: Home / Self Care | Payer: BC Managed Care – PPO | Attending: Family Medicine | Admitting: Family Medicine

## 2020-03-23 ENCOUNTER — Other Ambulatory Visit: Payer: Self-pay

## 2020-03-23 ENCOUNTER — Encounter: Payer: Self-pay | Admitting: Emergency Medicine

## 2020-03-23 DIAGNOSIS — M25562 Pain in left knee: Secondary | ICD-10-CM

## 2020-03-23 MED ORDER — DEXAMETHASONE SODIUM PHOSPHATE 10 MG/ML IJ SOLN
10.0000 mg | Freq: Once | INTRAMUSCULAR | Status: AC
  Administered 2020-03-23: 10 mg via INTRAMUSCULAR

## 2020-03-23 MED ORDER — DEXAMETHASONE SODIUM PHOSPHATE 10 MG/ML IJ SOLN: 10.0000 mg | Freq: Once | INTRAMUSCULAR | Status: DC

## 2020-03-23 MED ORDER — TIZANIDINE HCL 4 MG PO TABS: 4.0000 mg | ORAL_TABLET | Freq: Four times a day (QID) | ORAL | 0 refills | Status: DC | PRN

## 2020-03-23 MED ORDER — MELOXICAM 15 MG PO TABS: 15.0000 mg | ORAL_TABLET | Freq: Every day | ORAL | 1 refills | Status: DC

## 2020-03-23 NOTE — ED Provider Notes (Signed)
[facility]    CSN: [id number] Arrival date & time: 03/23/20  2094      History   Chief Complaint Chief Complaint  Patient presents with  . Knee Pain    HPI Jodi Jimenez is a 59 y.o. female.   HPI  Left knee pain x 1 week.  Pain with laying in bed, walking pain is worsened. She experienced a similar pattern of pain previously and achieved relief with Meloxicam and steroid shot. She has taken OTC medication without relief. Denies any known injury.  Past Medical History:  Diagnosis Date  . Abnormal tilt table test    syncope  . Breast mass, right   . GERD (gastroesophageal reflux disease)    resolved   . Helicobacter pylori gastritis 12/19/2012   AUG 2014 EGD-ABO BID FOR 10 DAYS   . HTN (hypertension)   . PONV (postoperative nausea and vomiting)   . Red birthmarks    left hand and forearm  . Syncope and collapse    Dr. Jens Som, tilt table test+    Patient Active Problem List   Diagnosis Date Noted  . Incarcerated ventral hernia 01/05/2019  . Postoperative state 01/23/2015  . GERD (gastroesophageal reflux disease) 02/15/2013  . Helicobacter pylori gastritis 12/19/2012  . Rectal bleeding 12/08/2012  . Diarrhea 12/08/2012  . Change in bowel function 12/08/2012  . Syncope 01/23/2011  . HTN (hypertension) 01/23/2011  . ELBOW PAIN 01/21/2009  . LATERAL EPICONDYLITIS 01/21/2009    Past Surgical History:  Procedure Laterality Date  . ABDOMINAL HYSTERECTOMY     vaginal with ovarian preservation   . BREAST LUMPECTOMY WITH RADIOACTIVE SEED LOCALIZATION Right 12/24/2017   Procedure: BREAST LUMPECTOMY WITH RADIOACTIVE SEED LOCALIZATION;  Surgeon: Alphonsa Overall, MD;  Location: [facility];  Service: General;  Laterality: Right;  . CHOLECYSTECTOMY    . COLONOSCOPY N/A 12/13/2012   Procedure: COLONOSCOPY;  Surgeon: Danie Binder, MD;  [facility];  Service: Endoscopy;  Laterality: N/A;  10:45  . CYSTOCELE REPAIR N/A 01/23/2015    Procedure: ANTERIOR REPAIR (CYSTOCELE);  Surgeon: Princess Bruins, MD;  Location: [facility];  Service: Gynecology;  Laterality: N/A;  . ELBOW ARTHROSCOPY Right    repair torn ligament  . ESOPHAGOGASTRODUODENOSCOPY N/A 12/13/2012   Procedure: ESOPHAGOGASTRODUODENOSCOPY (EGD);  Surgeon: Danie Binder, MD;  [facility];  Service: Endoscopy;  Laterality: N/A;  . LAPAROSCOPIC ASSISTED VENTRAL HERNIA REPAIR  01/05/2019  . RECTOCELE REPAIR  2006  . ROBOTIC ASSISTED LAPAROSCOPIC SACROCOLPOPEXY N/A 03/12/2016   Procedure: ROBOTIC ASSISTED LAPAROSCOPIC SACROCOLPOPEXY with Carrolyn Meiers;  Surgeon: Princess Bruins, MD;  Location: [facility];  Service: Gynecology;  Laterality: N/A;  Requests 2 1/2 hrs. REQUEST TINA RNFA  . take down of bladder sling  2006  . tilt table test positive     heart rate "flatlined"  . TUBAL LIGATION    . VAGINAL HYSTERECTOMY  2006   with bladder sling  . VENTRAL HERNIA REPAIR N/A 01/05/2019   Procedure: LAPAROSCOPIC VENTRAL INCISIONAL HERNIA WITH MESH;  Surgeon: Alphonsa Overall, MD;  Location: [facility];  Service: General;  Laterality: N/A;    OB History    Gravida  3   Para  3   Term      Preterm      AB      Living  3     SAB      TAB      Ectopic      Multiple  Live Births               Home Medications    Prior to Admission medications   Medication Sig Start Date End Date Taking? Authorizing Provider  acetaminophen (TYLENOL) 500 MG tablet Take 1,000 mg by mouth every 6 (six) hours as needed for moderate pain.     [provider]  Black Cohosh 80 MG CAPS Take 320 mg by mouth daily.    [provider]  estradiol (ESTRACE) 0.5 MG tablet Take 1 tablet (0.5 mg total) by mouth daily. Patient not taking: Reported on 12/26/2018 07/30/17   Princess Bruins, MD  Evening Primrose Oil 1000 MG CAPS Take 1,000 mg by mouth daily.    [provider]  lisinopril-hydrochlorothiazide (PRINZIDE,ZESTORETIC) 20-25 MG tablet Take 1  tablet by mouth daily.    [provider]  ondansetron (ZOFRAN-ODT) 8 MG disintegrating tablet Place 8 mg inside cheek every 8 (eight) hours as needed. 06/07/19   [provider]  predniSONE (DELTASONE) 20 MG tablet Take 40 mg by mouth daily. 06/08/19   [provider]  promethazine (PHENERGAN) 25 MG tablet Take 25 mg by mouth every 6 (six) hours as needed. 06/08/19   [provider]    Family History Family History  Problem Relation Age of Onset  . Heart failure Other        x3 maternal side  . Heart attack Other   . Diabetes Other   . Heart disease Other        paternal side  . Diabetes Mother   . Hypertension Mother   . Hypertension Father   . Diabetes Maternal Grandmother   . Hypertension Maternal Grandmother   . Diabetes Maternal Grandfather   . Hypertension Maternal Grandfather   . Hypertension Paternal Grandmother   . Hypertension Paternal Grandfather   . Colon cancer Neg Hx     Social History Social History   Tobacco Use  . Smoking status: Never Smoker  . Smokeless tobacco: Never Used  Vaping Use  . Vaping Use: Never used  Substance Use Topics  . Alcohol use: Yes    Comment: occ  . Drug use: No     Allergies   Codeine   Review of Systems Review of Systems Pertinent negatives listed in HPI   Physical Exam Triage Vital Signs ED Triage Vitals  Enc Vitals Group     BP 03/23/20 0831 (!) 176/85     Pulse Rate 03/23/20 0831 77     Resp 03/23/20 0831 14     Temp 03/23/20 0831 98.1 F (36.7 C)     Temp Source 03/23/20 0831 Oral     SpO2 03/23/20 0831 97 %     Weight 03/23/20 0836 196 lb (88.9 kg)     Height 03/23/20 0836 5\' 1"  (1.549 m)     Head Circumference --      Peak Flow --      Pain Score 03/23/20 0836 8     Pain Loc --      Pain Edu? --      Excl. in [place]? --    No data found.  Updated Vital Signs BP (!) 176/85 (BP Location: Right Arm)   Pulse 77   Temp 98.1 F (36.7 C) (Oral)   Resp 14   Ht 5\' 1"   (1.549 m)   Wt 196 lb (88.9 kg)   SpO2 97%   BMI 37.03 kg/m   Visual Acuity Right Eye Distance:  Left Eye Distance:   Bilateral Distance:    Right Eye Near:   Left Eye Near:    Bilateral Near:     Physical Exam   UC Treatments / Results  Labs (all labs ordered are listed, but only abnormal results are displayed) Labs Reviewed - No data to display  EKG   Radiology No results found.  Procedures Procedures (including critical care time)  Medications Ordered in UC Medications  dexamethasone (DECADRON) injection 10 mg (10 mg Intramuscular Given 03/23/20 0859)    Initial Impression / Assessment and Plan / UC Course  I have reviewed the triage vital signs and the nursing notes.  Pertinent labs & imaging results that were available during my care of the patient were reviewed by me and considered in my medical decision making (see chart for details).     Recurrent, chronic knee pain, left.  Decadron 10 mg IM given in clinic. For home management, start Meloxicam and Tizanidine. Follow-up with Orthopedics as needed. Final Clinical Impressions(s) / UC Diagnoses   Final diagnoses:  Left lateral knee pain   Discharge Instructions   None    ED Prescriptions    Medication Sig Dispense Auth. Provider   meloxicam (MOBIC) 15 MG tablet Take 1 tablet (15 mg total) by mouth daily. 30 tablet Scot Jun, FNP   tiZANidine (ZANAFLEX) 4 MG tablet Take 1 tablet (4 mg total) by mouth every 6 (six) hours as needed for muscle spasms. 30 tablet Scot Jun, FNP     PDMP not reviewed this encounter.   Trellis, Guirguis, FNP 03/26/20 [phone]

## 2020-03-23 NOTE — ED Triage Notes (Signed)
LT knee pain that started x 1 week ago. Denies any injury. States she had this same problem x 63months ago, was given shot and oral meds that helped the pain.

## 2020-03-29 ENCOUNTER — Other Ambulatory Visit: Payer: Self-pay

## 2020-03-29 ENCOUNTER — Ambulatory Visit
Admission: EM | Admit: 2020-03-29 | Discharge: 2020-03-29 | Disposition: A | Discharge status: Home / Self Care | Payer: BC Managed Care – PPO | Attending: Emergency Medicine | Admitting: Emergency Medicine

## 2020-03-29 DIAGNOSIS — M25562 Pain in left knee: Secondary | ICD-10-CM | POA: Diagnosis not present

## 2020-03-29 MED ORDER — DEXAMETHASONE SODIUM PHOSPHATE 10 MG/ML IJ SOLN
10.0000 mg | Freq: Once | INTRAMUSCULAR | Status: AC
  Administered 2020-03-29: 10 mg via INTRAMUSCULAR

## 2020-03-29 MED ORDER — PREDNISONE 10 MG PO TABS: 20.0000 mg | ORAL_TABLET | Freq: Every day | ORAL | 0 refills | Status: DC

## 2020-03-29 NOTE — ED Notes (Signed)
Triage by provider

## 2020-03-29 NOTE — ED Provider Notes (Signed)
[place]   [id number] 03/29/20 Arrival Time: 9983   Chief Complaint  Patient presents with  . Knee Pain     SUBJECTIVE: History from: patient and family.  Jodi Jimenez is a 59 y.o. female who presented to the urgent care for complaint of left knee pain for the past 2 weeks.  Was seen in urgent care previously was prescribed meloxicam and Decadron IM with mild symptom relief.  She localizes the pain to her left knee.  She describes the pain as constant achy.  She has tried OTC medications without relief.  Her symptoms are made worse with ROM.  She denies similar symptoms in the past.  Denies chills, fever, nausea, vomiting, diarrhea  ROS: As per HPI.  All other pertinent ROS negative.     Past Medical History:  Diagnosis Date  . Abnormal tilt table test    syncope  . Breast mass, right   . GERD (gastroesophageal reflux disease)    resolved   . Helicobacter pylori gastritis 12/19/2012   AUG 2014 EGD-ABO BID FOR 10 DAYS   . HTN (hypertension)   . PONV (postoperative nausea and vomiting)   . Red birthmarks    left hand and forearm  . Syncope and collapse    Dr. Jens Som, tilt table test+   Past Surgical History:  Procedure Laterality Date  . ABDOMINAL HYSTERECTOMY     vaginal with ovarian preservation   . BREAST LUMPECTOMY WITH RADIOACTIVE SEED LOCALIZATION Right 12/24/2017   Procedure: BREAST LUMPECTOMY WITH RADIOACTIVE SEED LOCALIZATION;  Surgeon: Alphonsa Overall, MD;  Location: [facility];  Service: General;  Laterality: Right;  . CHOLECYSTECTOMY    . COLONOSCOPY N/A 12/13/2012   Procedure: COLONOSCOPY;  Surgeon: Danie Binder, MD;  [facility];  Service: Endoscopy;  Laterality: N/A;  10:45  . CYSTOCELE REPAIR N/A 01/23/2015   Procedure: ANTERIOR REPAIR (CYSTOCELE);  Surgeon: Princess Bruins, MD;  Location: [facility];  Service: Gynecology;  Laterality: N/A;  . ELBOW ARTHROSCOPY Right    repair torn ligament  .  ESOPHAGOGASTRODUODENOSCOPY N/A 12/13/2012   Procedure: ESOPHAGOGASTRODUODENOSCOPY (EGD);  Surgeon: Danie Binder, MD;  [facility];  Service: Endoscopy;  Laterality: N/A;  . LAPAROSCOPIC ASSISTED VENTRAL HERNIA REPAIR  01/05/2019  . RECTOCELE REPAIR  2006  . ROBOTIC ASSISTED LAPAROSCOPIC SACROCOLPOPEXY N/A 03/12/2016   Procedure: ROBOTIC ASSISTED LAPAROSCOPIC SACROCOLPOPEXY with Carrolyn Meiers;  Surgeon: Princess Bruins, MD;  Location: [facility];  Service: Gynecology;  Laterality: N/A;  Requests 2 1/2 hrs. REQUEST TINA RNFA  . take down of bladder sling  2006  . tilt table test positive     heart rate "flatlined"  . TUBAL LIGATION    . VAGINAL HYSTERECTOMY  2006   with bladder sling  . VENTRAL HERNIA REPAIR N/A 01/05/2019   Procedure: LAPAROSCOPIC VENTRAL INCISIONAL HERNIA WITH MESH;  Surgeon: Alphonsa Overall, MD;  Location: [facility];  Service: General;  Laterality: N/A;   Allergies  Allergen Reactions  . Codeine Nausea And Vomiting   No current facility-administered medications on file prior to encounter.   Current Outpatient Medications on File Prior to Encounter  Medication Sig Dispense Refill  . acetaminophen (TYLENOL) 500 MG tablet Take 1,000 mg by mouth every 6 (six) hours as needed for moderate pain.     . Black Cohosh 80 MG CAPS Take 320 mg by mouth daily.    Marland Kitchen estradiol (ESTRACE) 0.5 MG tablet Take 1 tablet (0.5 mg total) by mouth daily. (Patient  not taking: Reported on 12/26/2018) 90 tablet 4  . Evening Primrose Oil 1000 MG CAPS Take 1,000 mg by mouth daily.    Marland Kitchen lisinopril-hydrochlorothiazide (PRINZIDE,ZESTORETIC) 20-25 MG tablet Take 1 tablet by mouth daily.    . meloxicam (MOBIC) 15 MG tablet Take 1 tablet (15 mg total) by mouth daily. 30 tablet 1  . ondansetron (ZOFRAN-ODT) 8 MG disintegrating tablet Place 8 mg inside cheek every 8 (eight) hours as needed.    . promethazine (PHENERGAN) 25 MG tablet Take 25 mg by mouth every 6 (six) hours as needed.    Marland Kitchen tiZANidine (ZANAFLEX)  4 MG tablet Take 1 tablet (4 mg total) by mouth every 6 (six) hours as needed for muscle spasms. 30 tablet 0   Social History   Socioeconomic History  . Marital status: Married    Spouse name: Not on file  . Number of children: 3  . Years of education: Not on file  . Highest education level: Not on file  Occupational History  . Occupation: Paediatric nurse - Manufacturing systems engineer  Tobacco Use  . Smoking status: Never Smoker  . Smokeless tobacco: Never Used  Vaping Use  . Vaping Use: Never used  Substance and Sexual Activity  . Alcohol use: Yes    Comment: occ  . Drug use: No  . Sexual activity: Not Currently    Birth control/protection: Surgical    Comment: 1st intercourse- 15, partners- 4, married- 23 yrs   Other Topics Concern  . Not on file  Social History Narrative  . Not on file   Social Determinants of Health   Financial Resource Strain:   . Difficulty of Paying Living Expenses: Not on file  Food Insecurity:   . Worried About Charity fundraiser in the Last Year: Not on file  . Ran Out of Food in the Last Year: Not on file  Transportation Needs:   . Lack of Transportation (Medical): Not on file  . Lack of Transportation (Non-Medical): Not on file  Physical Activity:   . Days of Exercise per Week: Not on file  . Minutes of Exercise per Session: Not on file  Stress:   . Feeling of Stress : Not on file  Social Connections:   . Frequency of Communication with Friends and Family: Not on file  . Frequency of Social Gatherings with Friends and Family: Not on file  . Attends Religious Services: Not on file  . Active Member of Clubs or Organizations: Not on file  . Attends Archivist Meetings: Not on file  . Marital Status: Not on file  Intimate Partner Violence:   . Fear of Current or Ex-Partner: Not on file  . Emotionally Abused: Not on file  . Physically Abused: Not on file  . Sexually Abused: Not on file   Family History  Problem Relation Age of Onset  . Heart  failure Other        x3 maternal side  . Heart attack Other   . Diabetes Other   . Heart disease Other        paternal side  . Diabetes Mother   . Hypertension Mother   . Hypertension Father   . Diabetes Maternal Grandmother   . Hypertension Maternal Grandmother   . Diabetes Maternal Grandfather   . Hypertension Maternal Grandfather   . Hypertension Paternal Grandmother   . Hypertension Paternal Grandfather   . Colon cancer Neg Hx     OBJECTIVE:  Vitals:   03/29/20 1016  BP: (!) 154/85  Pulse: 68  Resp: 16  Temp: 98.1 F (36.7 C)  SpO2: 98%     Physical Exam Vitals and nursing note reviewed.  Constitutional:      General: She is not in acute distress.    Appearance: Normal appearance. She is normal weight. She is not ill-appearing, toxic-appearing or diaphoretic.  HENT:     Head: Normocephalic.  Cardiovascular:     Rate and Rhythm: Normal rate and regular rhythm.     Pulses: Normal pulses.     Heart sounds: Normal heart sounds. No murmur heard.  No friction rub. No gallop.   Pulmonary:     Effort: Pulmonary effort is normal. No respiratory distress.     Breath sounds: Normal breath sounds. No stridor. No wheezing, rhonchi or rales.  Chest:     Chest wall: No tenderness.  Musculoskeletal:        General: Tenderness present. No signs of injury.     Comments: Left knee is without any obvious asymmetry or deformity when compared to the right knee.  There is no ecchymosis, open wound, lesion or warmth present.  Limited range of motion due to pain.  Neurovascular status intact.  Neurological:     Mental Status: She is alert and oriented to person, place, and time.     LABS:  No results found for this or any previous visit (from the past 24 hour(s)).   ASSESSMENT & PLAN:  1. Acute pain of left knee     Meds ordered this encounter  Medications  . predniSONE (DELTASONE) 10 MG tablet    Sig: Take 2 tablets (20 mg total) by mouth daily.    Dispense:  15 tablet     Refill:  0  . dexamethasone (DECADRON) injection 10 mg    Discharge instructions  Continue meloxicam and Zanaflex as prescribed and directed Decadron IM was given in office Prednisone was prescribed Follow-up with PCP Return or go to ED if you develop any new or worsening of symptoms   Reviewed expectations re: course of current medical issues. Questions answered. Outlined signs and symptoms indicating need for more acute intervention. Patient verbalized understanding. After Visit Summary given.         Emerson Monte, FNP 03/29/20 1125

## 2020-03-29 NOTE — Discharge Instructions (Addendum)
Continue meloxicam and Zanaflex as prescribed and directed Decadron IM was given in office Prednisone was prescribed Follow-up with PCP Return or go to ED if you develop any new or worsening of symptoms

## 2020-04-08 ENCOUNTER — Other Ambulatory Visit: Payer: Self-pay | Admitting: Family Medicine

## 2020-04-08 NOTE — Telephone Encounter (Signed)
  Requested Prescriptions  Pending Prescriptions Disp Refills   tiZANidine (ZANAFLEX) 4 MG tablet [Pharmacy Med Name: tiZANidine HCl 4 MG Oral Tablet] 30 tablet 0    Sig: TAKE 1 TABLET BY MOUTH EVERY 6 HOURS AS NEEDED FOR MUSCLE SPASM      There is no refill protocol information for this order

## 2020-11-21 IMAGING — CT CT ABD-PELV W/ CM
2 of 5 series · 16 of 46 positions shown, 18 images · IV contrast (omnipaque)
Comparison: CT abdomen and pelvis 11/25/2018.

CLINICAL DATA: Right upper quadrant pain for 1 month. The patient
reports a positive 20QDA-KT test 9 days ago.

EXAM:
CT ABDOMEN AND PELVIS WITH CONTRAST
TECHNIQUE: Multidetector CT imaging of the abdomen and pelvis was performed
using the standard protocol following bolus administration of
intravenous contrast.
CONTRAST:  100 mL OMNIPAQUE IOHEXOL 300 MG/ML  SOLN

[Series 2: axial st · axial · 0.72mm/px · z∈[+1050,+1475]mm · 13 of 99 slices shown, 15 images]
[im 7/99  soft-tissue]
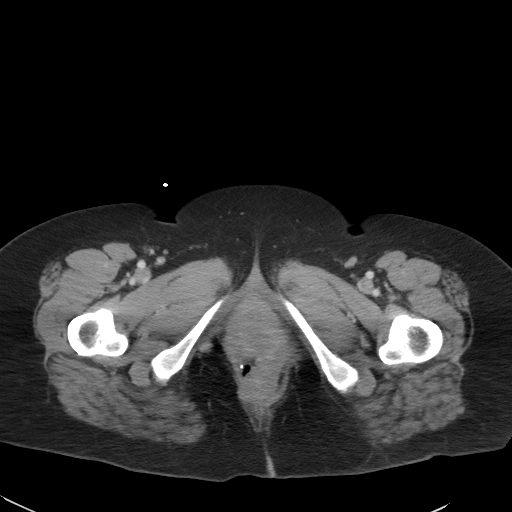
[im 7/99  bone]
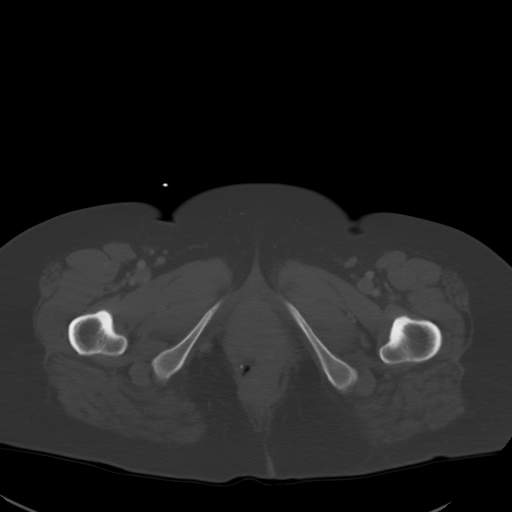
[im 14/99  soft-tissue]
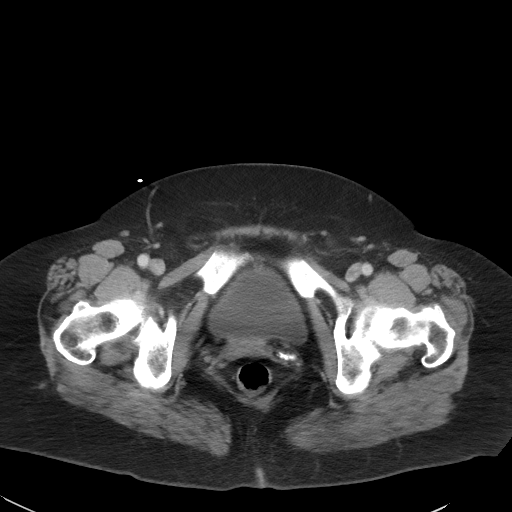
[im 20/99  soft-tissue]
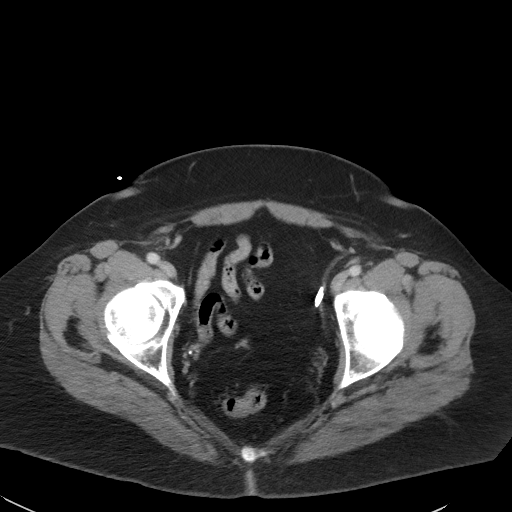
[im 27/99  soft-tissue]
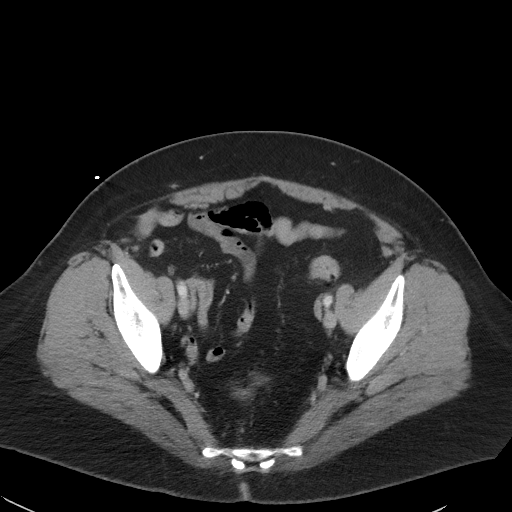
[im 33/99  soft-tissue]
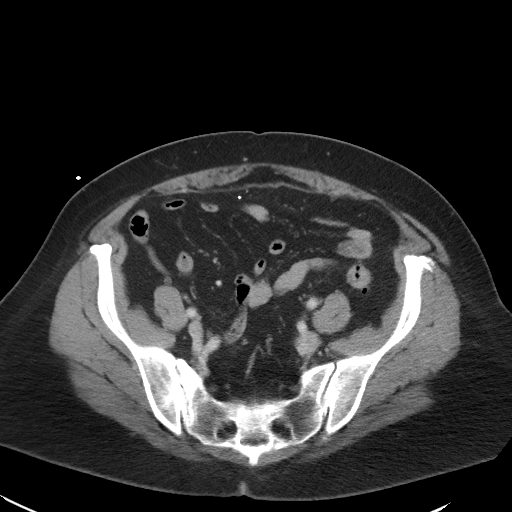
[im 40/99  soft-tissue]
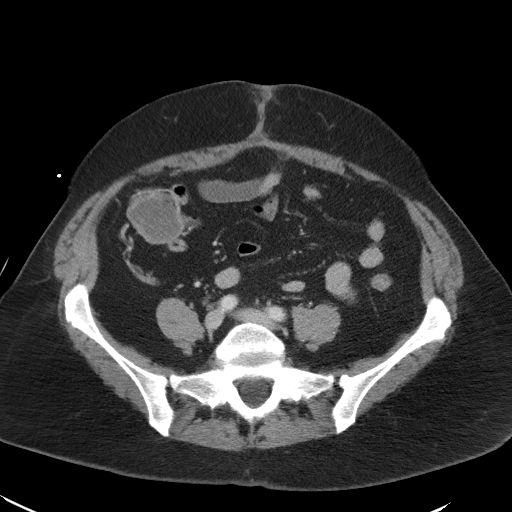
[im 53/99  soft-tissue]
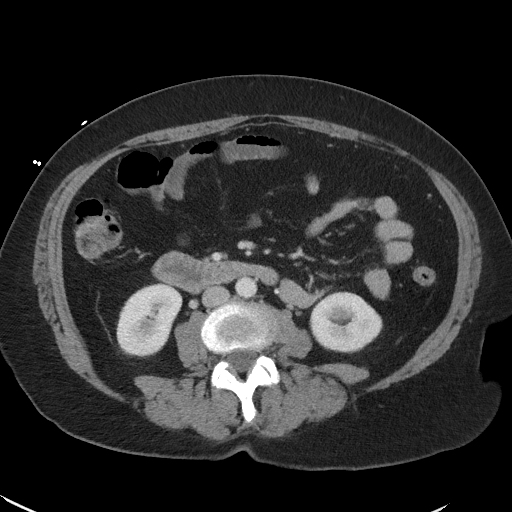
[im 59/99  soft-tissue]
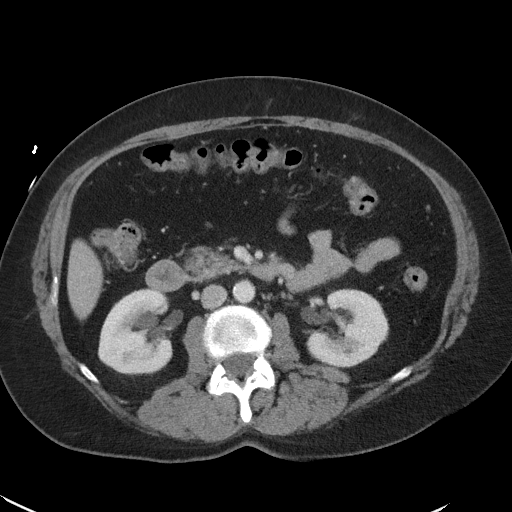
[im 66/99  soft-tissue]
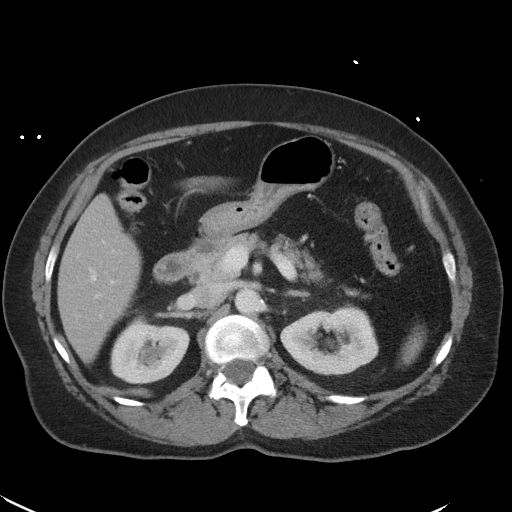
[im 66/99  bone]
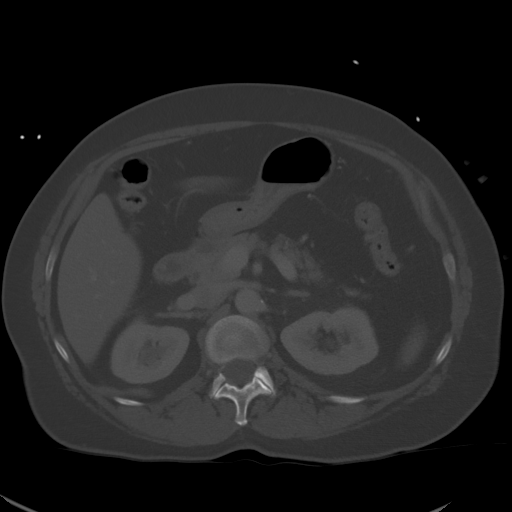
[im 72/99  soft-tissue]
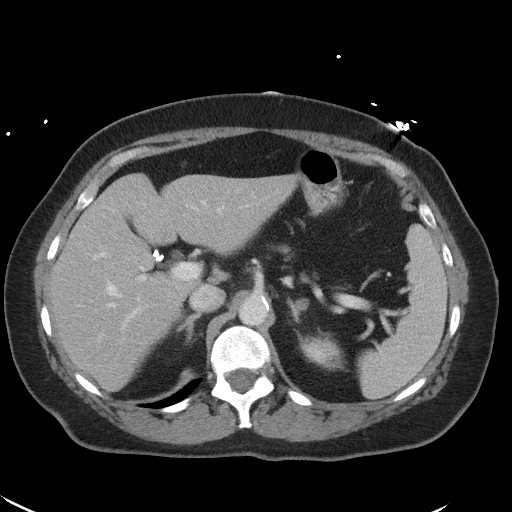
[im 79/99  soft-tissue]
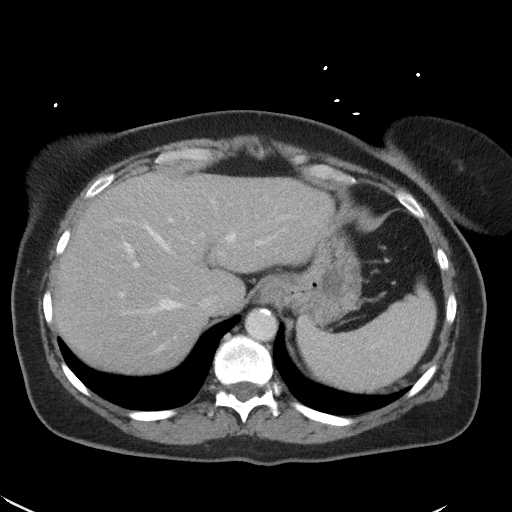
[im 85/99  soft-tissue]
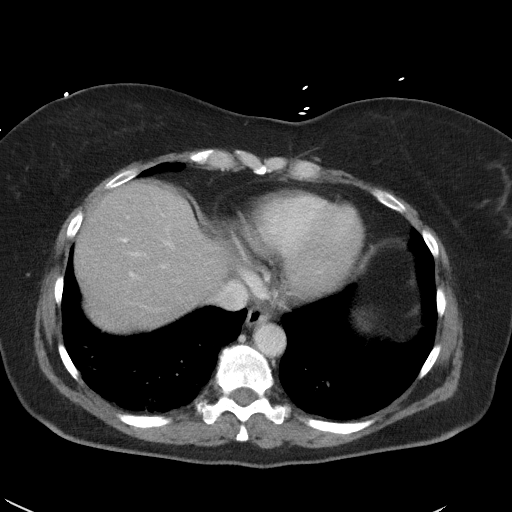
[im 92/99  soft-tissue]
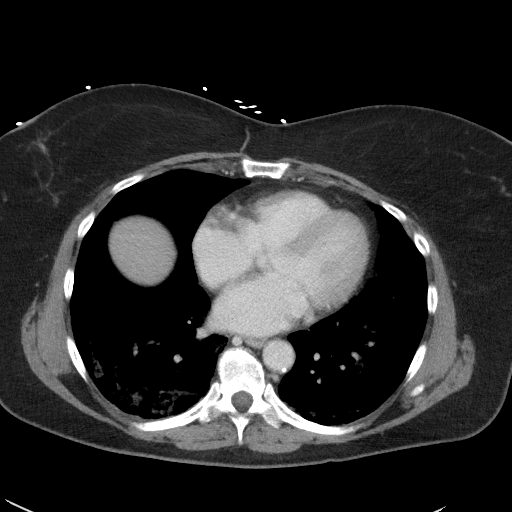

[Series 5: coronal st · coronal · 0.75mm/px · 3 of 101 slices shown]
[im 34/101  soft-tissue]
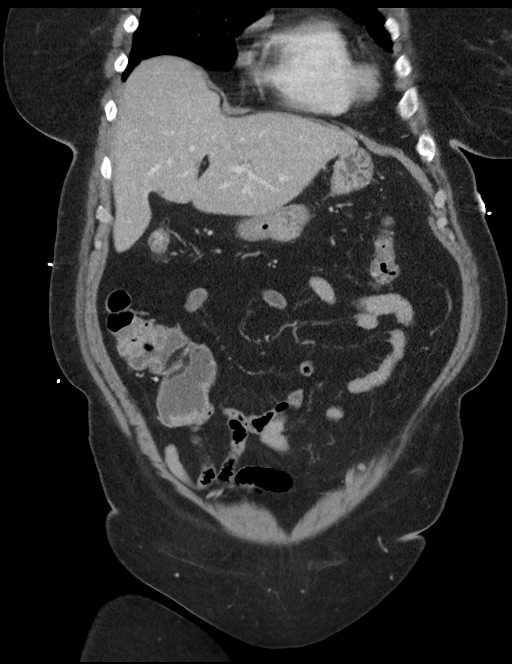
[im 45/101  soft-tissue]
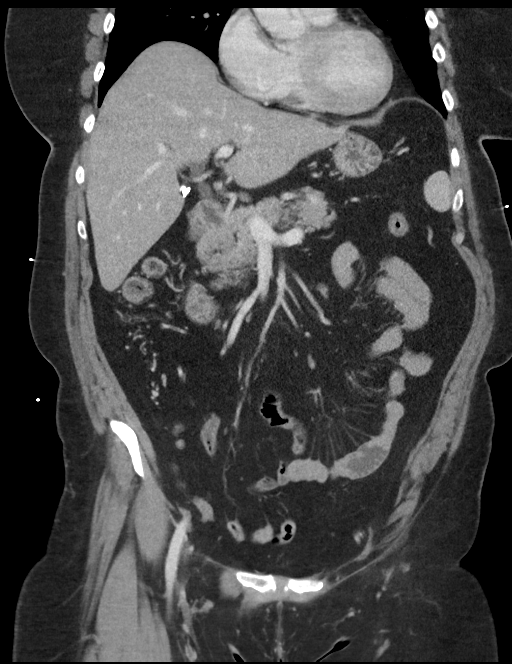
[im 56/101  soft-tissue]
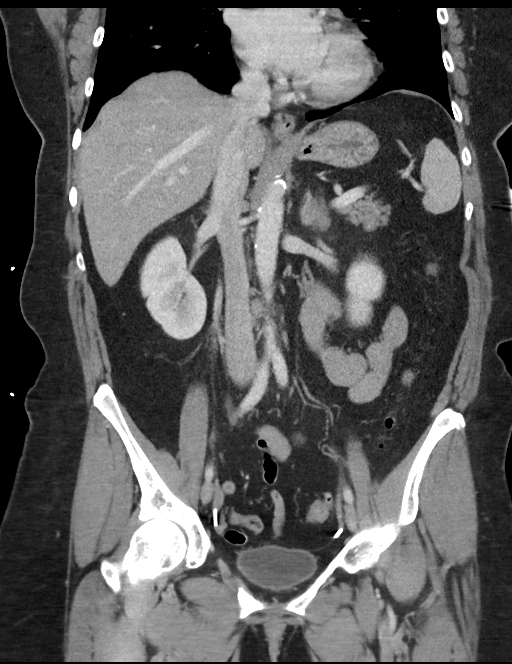

[16 of 46 positions shown; findings below may reference images not displayed]

FINDINGS: Lower chest: There is ground-glass attenuating airspace disease in
the right middle lobe, lingula and both lower lobes. Opacities are
most dense in the right lower lobe. No pleural or pericardial
effusion. Calcific coronary artery disease noted.

Hepatobiliary: No focal liver abnormality is seen. Status post
cholecystectomy. No biliary dilatation.

Pancreas: Unremarkable. No pancreatic ductal dilatation or
surrounding inflammatory changes.

Spleen: Normal in size without focal abnormality.

Adrenals/Urinary Tract: Adrenal glands are unremarkable. Kidneys are
normal, without renal calculi, worrisome focal lesion, or
hydronephrosis. A few small parapelvic cysts are seen in both
kidneys. Bladder is unremarkable.

Stomach/Bowel: Stomach is within normal limits. Appendix appears
normal. No evidence of bowel wall thickening, distention, or
inflammatory changes. Scattered diverticulosis is most notable in
the sigmoid colon.

Vascular/Lymphatic: Aortic atherosclerosis. No enlarged abdominal or
pelvic lymph nodes.

Reproductive: Status post hysterectomy. No adnexal masses.

Other: None.

Musculoskeletal: No acute or focal abnormality. Right hip
osteoarthritis noted.
IMPRESSION: Airspace disease in the lingula, right middle lobe and lower lobes
bilaterally has an appearance consistent with pneumonia and is
typical of 20QDA-KT infection.

No acute abnormality abdomen or pelvis.

Diverticulosis without diverticulitis.

Calcific and aortic coronary atherosclerosis.

## 2021-02-07 DIAGNOSIS — Z1231 Encounter for screening mammogram for malignant neoplasm of breast: Secondary | ICD-10-CM | POA: Diagnosis not present

## 2021-05-02 ENCOUNTER — Ambulatory Visit
Admission: EM | Admit: 2021-05-02 | Discharge: 2021-05-02 | Disposition: A | Discharge status: Home / Self Care | Payer: BC Managed Care – PPO

## 2021-05-02 ENCOUNTER — Other Ambulatory Visit: Payer: Self-pay

## 2021-05-02 ENCOUNTER — Telehealth: Payer: Self-pay | Admitting: Urgent Care

## 2021-05-02 DIAGNOSIS — R0789 Other chest pain: Secondary | ICD-10-CM

## 2021-05-02 DIAGNOSIS — R0602 Shortness of breath: Secondary | ICD-10-CM | POA: Diagnosis not present

## 2021-05-02 DIAGNOSIS — J069 Acute upper respiratory infection, unspecified: Secondary | ICD-10-CM

## 2021-05-02 DIAGNOSIS — M546 Pain in thoracic spine: Secondary | ICD-10-CM

## 2021-05-02 MED ORDER — PSEUDOEPHEDRINE HCL 30 MG PO TABS: 30.0000 mg | ORAL_TABLET | Freq: Three times a day (TID) | ORAL | 0 refills | Status: DC | PRN

## 2021-05-02 MED ORDER — PROMETHAZINE-DM 6.25-15 MG/5ML PO SYRP: 5.0000 mL | ORAL_SOLUTION | Freq: Every evening | ORAL | 0 refills | Status: DC | PRN

## 2021-05-02 MED ORDER — BENZONATATE 100 MG PO CAPS: 100.0000 mg | ORAL_CAPSULE | Freq: Three times a day (TID) | ORAL | 0 refills | Status: DC | PRN

## 2021-05-02 MED ORDER — CETIRIZINE HCL 10 MG PO TABS: 10.0000 mg | ORAL_TABLET | Freq: Every day | ORAL | 0 refills | Status: DC

## 2021-05-02 NOTE — ED Provider Notes (Signed)
[place]   MRN: [id number] DOB: 03-09-1961  Subjective:   Jodi Jimenez is a 60 y.o. female presenting for 3-day history of acute onset fever, coughing and shortness of breath for the past week.  The cough is elicited lateral chest pain and thoracic back pain.  Denies body aches, shortness of breath or wheezing, sinus pain, ear pain, throat pain.  No history of asthma or respiratory disorders.  Patient is not a smoker.  No history of pulmonary embolism.  No history of blood clots, long distance travel, recent hospitalizations.  No history of sarcoidosis.  No current facility-administered medications for this encounter.  Current Outpatient Medications:    acetaminophen (TYLENOL) 500 MG tablet, Take 1,000 mg by mouth every 6 (six) hours as needed for moderate pain. , Disp: , Rfl:    Black Cohosh 80 MG CAPS, Take 320 mg by mouth daily., Disp: , Rfl:    estradiol (ESTRACE) 0.5 MG tablet, Take 1 tablet (0.5 mg total) by mouth daily. (Patient not taking: Reported on 12/26/2018), Disp: 90 tablet, Rfl: 4   Evening Primrose Oil 1000 MG CAPS, Take 1,000 mg by mouth daily., Disp: , Rfl:    lisinopril-hydrochlorothiazide (PRINZIDE,ZESTORETIC) 20-25 MG tablet, Take 1 tablet by mouth daily., Disp: , Rfl:    meloxicam (MOBIC) 15 MG tablet, Take 1 tablet (15 mg total) by mouth daily., Disp: 30 tablet, Rfl: 1   ondansetron (ZOFRAN-ODT) 8 MG disintegrating tablet, Place 8 mg inside cheek every 8 (eight) hours as needed., Disp: , Rfl:    predniSONE (DELTASONE) 10 MG tablet, Take 2 tablets (20 mg total) by mouth daily., Disp: 15 tablet, Rfl: 0   promethazine (PHENERGAN) 25 MG tablet, Take 25 mg by mouth every 6 (six) hours as needed., Disp: , Rfl:    tiZANidine (ZANAFLEX) 4 MG tablet, Take 1 tablet (4 mg total) by mouth every 6 (six) hours as needed for muscle spasms., Disp: 30 tablet, Rfl: 0   Allergies  Allergen Reactions   Codeine Nausea And Vomiting    Past Medical History:   Diagnosis Date   Abnormal tilt table test    syncope   Breast mass, right    GERD (gastroesophageal reflux disease)    resolved    Helicobacter pylori gastritis 12/19/2012   AUG 2014 EGD-ABO BID FOR 10 DAYS    HTN (hypertension)    PONV (postoperative nausea and vomiting)    Red birthmarks    left hand and forearm   Syncope and collapse    Dr. Jens Som, tilt table test+     Past Surgical History:  Procedure Laterality Date   ABDOMINAL HYSTERECTOMY     vaginal with ovarian preservation    BREAST LUMPECTOMY WITH RADIOACTIVE SEED LOCALIZATION Right 12/24/2017   Procedure: BREAST LUMPECTOMY WITH RADIOACTIVE SEED LOCALIZATION;  Surgeon: Alphonsa Overall, MD;  Location: [facility];  Service: General;  Laterality: Right;   CHOLECYSTECTOMY     COLONOSCOPY N/A 12/13/2012   Procedure: COLONOSCOPY;  Surgeon: Danie Binder, MD;  [facility];  Service: Endoscopy;  Laterality: N/A;  10:45   CYSTOCELE REPAIR N/A 01/23/2015   Procedure: ANTERIOR REPAIR (CYSTOCELE);  Surgeon: Princess Bruins, MD;  Location: [facility];  Service: Gynecology;  Laterality: N/A;   ELBOW ARTHROSCOPY Right    repair torn ligament   ESOPHAGOGASTRODUODENOSCOPY N/A 12/13/2012   Procedure: ESOPHAGOGASTRODUODENOSCOPY (EGD);  Surgeon: Danie Binder, MD;  [facility];  Service: Endoscopy;  Laterality: N/A;   LAPAROSCOPIC ASSISTED VENTRAL  HERNIA REPAIR  01/05/2019   RECTOCELE REPAIR  2006   ROBOTIC ASSISTED LAPAROSCOPIC SACROCOLPOPEXY N/A 03/12/2016   Procedure: ROBOTIC ASSISTED LAPAROSCOPIC SACROCOLPOPEXY with Carrolyn Meiers;  Surgeon: Princess Bruins, MD;  Location: [facility];  Service: Gynecology;  Laterality: N/A;  Requests 2 1/2 hrs. REQUEST TINA RNFA   take down of bladder sling  2006   tilt table test positive     heart rate "flatlined"   TUBAL LIGATION     VAGINAL HYSTERECTOMY  2006   with bladder sling   VENTRAL HERNIA REPAIR N/A 01/05/2019   Procedure: LAPAROSCOPIC VENTRAL INCISIONAL HERNIA  WITH MESH;  Surgeon: Alphonsa Overall, MD;  Location: [facility];  Service: General;  Laterality: N/A;    Family History  Problem Relation Age of Onset   Heart failure Other        x3 maternal side   Heart attack Other    Diabetes Other    Heart disease Other        paternal side   Diabetes Mother    Hypertension Mother    Hypertension Father    Diabetes Maternal Grandmother    Hypertension Maternal Grandmother    Diabetes Maternal Grandfather    Hypertension Maternal Grandfather    Hypertension Paternal Grandmother    Hypertension Paternal Grandfather    Colon cancer Neg Hx     Social History   Tobacco Use   Smoking status: Never   Smokeless tobacco: Never  Vaping Use   Vaping Use: Never used  Substance Use Topics   Alcohol use: Yes    Comment: occ   Drug use: No    ROS   Objective:   Vitals: BP (!) 152/92 (BP Location: Right Arm)    Pulse 85    Temp 98 F (36.7 C) (Oral)    Resp 18    SpO2 94%   Pulse oximetry 96% on recheck.   Physical Exam Constitutional:      General: She is not in acute distress.    Appearance: Normal appearance. She is well-developed. She is not ill-appearing, toxic-appearing or diaphoretic.  HENT:     Head: Normocephalic and atraumatic.     Right Ear: External ear normal.     Left Ear: External ear normal.     Nose: Nose normal.     Mouth/Throat:     Mouth: Mucous membranes are moist.  Eyes:     General: No scleral icterus.       Right eye: No discharge.        Left eye: No discharge.     Extraocular Movements: Extraocular movements intact.     Conjunctiva/sclera: Conjunctivae normal.  Cardiovascular:     Rate and Rhythm: Normal rate and regular rhythm.     Pulses: Normal pulses.     Heart sounds: Normal heart sounds. No murmur heard.   No friction rub. No gallop.  Pulmonary:     Effort: Pulmonary effort is normal. No respiratory distress.     Breath sounds: Normal breath sounds. No stridor. No wheezing, rhonchi or rales.   Skin:    General: Skin is warm and dry.     Findings: No rash.  Neurological:     Mental Status: She is alert and oriented to person, place, and time.  Psychiatric:        Mood and Affect: Mood normal.        Behavior: Behavior normal.        Thought Content: Thought content normal.  Assessment and Plan :   PDMP not reviewed this encounter.  1. Viral URI with cough   2. Shortness of breath   3. Atypical chest pain   4. Acute bilateral thoracic back pain    Patient declined a COVID or flu test.  I offered her chest x-ray but she declined.  Recommended supportive care for viral upper respiratory infection.  She has a low Wells criteria and therefore I have low suspicion for an acute pulmonary embolism, acute cardiopulmonary process. Counseled patient on potential for adverse effects with medications prescribed/recommended today, ER and return-to-clinic precautions discussed, patient verbalized understanding.    Jaynee Eagles, PA-C 05/02/21 1034

## 2021-05-02 NOTE — ED Triage Notes (Signed)
Patient presents to Urgent Care with complaints of a cough and SOB x 1 week. Last known fever 3 days ago. Treating cough with OTC cough meds with no improvement.   Denies fever.

## 2021-05-02 NOTE — Telephone Encounter (Signed)
Patient called. Unfortunately, due to oversight, I did not send the prescription to her pharmacy.  I did this now.

## 2021-05-02 NOTE — Discharge Instructions (Addendum)
We will manage this as a viral illness. For sore throat or cough try using a honey-based tea. Use 3 teaspoons of honey with juice squeezed from half lemon. Place shaved pieces of ginger into 1/2-1 cup of water and warm over stove top. Then mix the ingredients and repeat every 4 hours as needed. Please take ibuprofen 600mg  every 6 hours with food alternating with OR taken together with Tylenol 500mg -650mg  every 6 hours for throat pain, fevers, aches and pains. Hydrate very well with at least 2 liters of water. Eat light meals such as soups (chicken and noodles, vegetable, chicken and wild rice).  Do not eat foods that you are allergic to.  Taking an antihistamine like Zyrtec can help against postnasal drainage, sinus congestion.  You can take this together with pseudoephedrine (Sudafed) at a dose of 30-60 mg 3 times a day or twice daily as needed for the same kind of nasal drip, congestion.  Use the cough suppression medications as needed.

## 2021-05-18 ENCOUNTER — Emergency Department (HOSPITAL_COMMUNITY): Payer: BC Managed Care – PPO

## 2021-05-18 ENCOUNTER — Emergency Department (HOSPITAL_COMMUNITY)
Admission: EM | Admit: 2021-05-18 | Discharge: 2021-05-18 | Disposition: A | Discharge status: Home / Self Care | Payer: BC Managed Care – PPO | Attending: Emergency Medicine | Admitting: Emergency Medicine

## 2021-05-18 ENCOUNTER — Other Ambulatory Visit: Payer: Self-pay

## 2021-05-18 ENCOUNTER — Encounter: Payer: Self-pay | Admitting: Emergency Medicine

## 2021-05-18 ENCOUNTER — Encounter (HOSPITAL_COMMUNITY): Payer: Self-pay

## 2021-05-18 ENCOUNTER — Ambulatory Visit
Admission: EM | Admit: 2021-05-18 | Discharge: 2021-05-18 | Disposition: A | Discharge status: Home / Self Care | Payer: BC Managed Care – PPO

## 2021-05-18 DIAGNOSIS — Z9049 Acquired absence of other specified parts of digestive tract: Secondary | ICD-10-CM | POA: Diagnosis not present

## 2021-05-18 DIAGNOSIS — R109 Unspecified abdominal pain: Secondary | ICD-10-CM | POA: Insufficient documentation

## 2021-05-18 DIAGNOSIS — Z79899 Other long term (current) drug therapy: Secondary | ICD-10-CM | POA: Insufficient documentation

## 2021-05-18 DIAGNOSIS — M546 Pain in thoracic spine: Secondary | ICD-10-CM | POA: Insufficient documentation

## 2021-05-18 DIAGNOSIS — D72829 Elevated white blood cell count, unspecified: Secondary | ICD-10-CM | POA: Insufficient documentation

## 2021-05-18 DIAGNOSIS — K760 Fatty (change of) liver, not elsewhere classified: Secondary | ICD-10-CM | POA: Diagnosis not present

## 2021-05-18 DIAGNOSIS — K5790 Diverticulosis of intestine, part unspecified, without perforation or abscess without bleeding: Secondary | ICD-10-CM

## 2021-05-18 DIAGNOSIS — I10 Essential (primary) hypertension: Secondary | ICD-10-CM | POA: Diagnosis not present

## 2021-05-18 DIAGNOSIS — I700 Atherosclerosis of aorta: Secondary | ICD-10-CM | POA: Diagnosis not present

## 2021-05-18 LAB — CBC WITH DIFFERENTIAL/PLATELET
Abs Immature Granulocytes: 0.02 10*3/uL (ref 0.00–0.07)
Basophils Absolute: 0 10*3/uL (ref 0.0–0.1)
Basophils Relative: 1 %
Eosinophils Absolute: 0.4 10*3/uL (ref 0.0–0.5)
Eosinophils Relative: 5 %
HCT: 38.7 % (ref 36.0–46.0)
Hemoglobin: 12.7 g/dL (ref 12.0–15.0)
Immature Granulocytes: 0 %
Lymphocytes Relative: 22 %
Lymphs Abs: 1.9 10*3/uL (ref 0.7–4.0)
MCH: 32.7 pg (ref 26.0–34.0)
MCHC: 32.8 g/dL (ref 30.0–36.0)
MCV: 99.7 fL (ref 80.0–100.0)
Monocytes Absolute: 0.6 10*3/uL (ref 0.1–1.0)
Monocytes Relative: 8 %
Neutro Abs: 5.3 10*3/uL (ref 1.7–7.7)
Neutrophils Relative %: 64 %
Platelets: 225 10*3/uL (ref 150–400)
RBC: 3.88 MIL/uL (ref 3.87–5.11)
RDW: 12.9 % (ref 11.5–15.5)
WBC: 8.3 10*3/uL (ref 4.0–10.5)
nRBC: 0 % (ref 0.0–0.2)

## 2021-05-18 LAB — URINALYSIS, ROUTINE W REFLEX MICROSCOPIC
Bilirubin Urine: NEGATIVE
Glucose, UA: NEGATIVE mg/dL
Ketones, ur: NEGATIVE mg/dL
Nitrite: NEGATIVE
Protein, ur: NEGATIVE mg/dL
Specific Gravity, Urine: 1.015 (ref 1.005–1.030)
pH: 7 (ref 5.0–8.0)

## 2021-05-18 LAB — COMPREHENSIVE METABOLIC PANEL
ALT: 23 U/L (ref 0–44)
AST: 25 U/L (ref 15–41)
Albumin: 3.8 g/dL (ref 3.5–5.0)
Alkaline Phosphatase: 62 U/L (ref 38–126)
Anion gap: 7 (ref 5–15)
BUN: 15 mg/dL (ref 6–20)
CO2: 27 mmol/L (ref 22–32)
Calcium: 8.4 mg/dL — ABNORMAL LOW (ref 8.9–10.3)
Chloride: 103 mmol/L (ref 98–111)
Creatinine, Ser: 0.53 mg/dL (ref 0.44–1.00)
GFR, Estimated: >60 mL/min (ref >60)
Glucose, Bld: 98 mg/dL (ref 70–99)
Potassium: 3.5 mmol/L (ref 3.5–5.1)
Sodium: 137 mmol/L (ref 135–145)
Total Bilirubin: 0.3 mg/dL (ref 0.3–1.2)
Total Protein: 6.5 g/dL (ref 6.5–8.1)

## 2021-05-18 MED ORDER — METHOCARBAMOL 500 MG PO TABS: 500.0000 mg | ORAL_TABLET | Freq: Three times a day (TID) | ORAL | 0 refills | Status: DC

## 2021-05-18 MED ORDER — KETOROLAC TROMETHAMINE 30 MG/ML IJ SOLN
30.0000 mg | Freq: Once | INTRAMUSCULAR | Status: AC
  Administered 2021-05-18: 30 mg via INTRAVENOUS
  Filled 2021-05-18: qty 1

## 2021-05-18 MED ORDER — LIDOCAINE 5 % EX PTCH: 1.0000 | MEDICATED_PATCH | CUTANEOUS | 0 refills | Status: DC

## 2021-05-18 MED ORDER — NAPROXEN 500 MG PO TABS: 500.0000 mg | ORAL_TABLET | Freq: Two times a day (BID) | ORAL | 0 refills | Status: DC

## 2021-05-18 NOTE — ED Provider Notes (Signed)
[place]   MRN: [id number] DOB: 03/03/1961  Subjective:   Jodi Jimenez is a 61 y.o. female presenting for 3-day history of acute onset left thoracic/flank pain.  Symptoms are severe, constant.  She was seen about 2 weeks ago for a viral upper respiratory infection and feels like she improved from this.  She does not have any chest pain, shortness of breath or wheezing.  No fevers, nausea, vomiting, dysuria, hematuria, constipation, diarrhea, traumas, falls, bloody stools.  No history of musculoskeletal disorders.  No history of renal stones.  Patient did have a CT abdomen pelvis in February 2021.  She was shown to have scattered diverticulosis most notable in the sigmoid colon.  She has not had diverticulitis in the past.  No current facility-administered medications for this encounter.  Current Outpatient Medications:    acetaminophen (TYLENOL) 500 MG tablet, Take 1,000 mg by mouth every 6 (six) hours as needed for moderate pain. , Disp: , Rfl:    benzonatate (TESSALON) 100 MG capsule, Take 1-2 capsules (100-200 mg total) by mouth 3 (three) times daily as needed for cough., Disp: 60 capsule, Rfl: 0   Black Cohosh 80 MG CAPS, Take 320 mg by mouth daily., Disp: , Rfl:    cetirizine (ZYRTEC ALLERGY) 10 MG tablet, Take 1 tablet (10 mg total) by mouth daily., Disp: 30 tablet, Rfl: 0   estradiol (ESTRACE) 0.5 MG tablet, Take 1 tablet (0.5 mg total) by mouth daily. (Patient not taking: Reported on 12/26/2018), Disp: 90 tablet, Rfl: 4   Evening Primrose Oil 1000 MG CAPS, Take 1,000 mg by mouth daily., Disp: , Rfl:    lisinopril-hydrochlorothiazide (PRINZIDE,ZESTORETIC) 20-25 MG tablet, Take 1 tablet by mouth daily., Disp: , Rfl:    meloxicam (MOBIC) 15 MG tablet, Take 1 tablet (15 mg total) by mouth daily., Disp: 30 tablet, Rfl: 1   ondansetron (ZOFRAN-ODT) 8 MG disintegrating tablet, Place 8 mg inside cheek every 8 (eight) hours as needed., Disp: , Rfl:    predniSONE  (DELTASONE) 10 MG tablet, Take 2 tablets (20 mg total) by mouth daily., Disp: 15 tablet, Rfl: 0   promethazine (PHENERGAN) 25 MG tablet, Take 25 mg by mouth every 6 (six) hours as needed., Disp: , Rfl:    promethazine-dextromethorphan (PROMETHAZINE-DM) 6.25-15 MG/5ML syrup, Take 5 mLs by mouth at bedtime as needed for cough., Disp: 100 mL, Rfl: 0   pseudoephedrine (SUDAFED) 30 MG tablet, Take 1 tablet (30 mg total) by mouth every 8 (eight) hours as needed for congestion., Disp: 30 tablet, Rfl: 0   tiZANidine (ZANAFLEX) 4 MG tablet, Take 1 tablet (4 mg total) by mouth every 6 (six) hours as needed for muscle spasms., Disp: 30 tablet, Rfl: 0   Allergies  Allergen Reactions   Codeine Nausea And Vomiting    Past Medical History:  Diagnosis Date   Abnormal tilt table test    syncope   Breast mass, right    GERD (gastroesophageal reflux disease)    resolved    Helicobacter pylori gastritis 12/19/2012   AUG 2014 EGD-ABO BID FOR 10 DAYS    HTN (hypertension)    PONV (postoperative nausea and vomiting)    Red birthmarks    left hand and forearm   Syncope and collapse    Dr. Jens Som, tilt table test+     Past Surgical History:  Procedure Laterality Date   ABDOMINAL HYSTERECTOMY     vaginal with ovarian preservation    BREAST LUMPECTOMY WITH RADIOACTIVE SEED LOCALIZATION Right 12/24/2017  Procedure: BREAST LUMPECTOMY WITH RADIOACTIVE SEED LOCALIZATION;  Surgeon: Alphonsa Overall, MD;  Location: [facility];  Service: General;  Laterality: Right;   CHOLECYSTECTOMY     COLONOSCOPY N/A 12/13/2012   Procedure: COLONOSCOPY;  Surgeon: Danie Binder, MD;  [facility];  Service: Endoscopy;  Laterality: N/A;  10:45   CYSTOCELE REPAIR N/A 01/23/2015   Procedure: ANTERIOR REPAIR (CYSTOCELE);  Surgeon: Princess Bruins, MD;  Location: [facility];  Service: Gynecology;  Laterality: N/A;   ELBOW ARTHROSCOPY Right    repair torn ligament   ESOPHAGOGASTRODUODENOSCOPY N/A 12/13/2012    Procedure: ESOPHAGOGASTRODUODENOSCOPY (EGD);  Surgeon: Danie Binder, MD;  Location: [facility];  Service: Endoscopy;  Laterality: N/A;   LAPAROSCOPIC ASSISTED VENTRAL HERNIA REPAIR  01/05/2019   RECTOCELE REPAIR  2006   ROBOTIC ASSISTED LAPAROSCOPIC SACROCOLPOPEXY N/A 03/12/2016   Procedure: ROBOTIC ASSISTED LAPAROSCOPIC SACROCOLPOPEXY with Carrolyn Meiers;  Surgeon: Princess Bruins, MD;  Location: [facility];  Service: Gynecology;  Laterality: N/A;  Requests 2 1/2 hrs. REQUEST TINA RNFA   take down of bladder sling  2006   tilt table test positive     heart rate "flatlined"   TUBAL LIGATION     VAGINAL HYSTERECTOMY  2006   with bladder sling   VENTRAL HERNIA REPAIR N/A 01/05/2019   Procedure: LAPAROSCOPIC VENTRAL INCISIONAL HERNIA WITH MESH;  Surgeon: Alphonsa Overall, MD;  Location: [facility];  Service: General;  Laterality: N/A;    Family History  Problem Relation Age of Onset   Heart failure Other        x3 maternal side   Heart attack Other    Diabetes Other    Heart disease Other        paternal side   Diabetes Mother    Hypertension Mother    Hypertension Father    Diabetes Maternal Grandmother    Hypertension Maternal Grandmother    Diabetes Maternal Grandfather    Hypertension Maternal Grandfather    Hypertension Paternal Grandmother    Hypertension Paternal Grandfather    Colon cancer Neg Hx     Social History   Tobacco Use   Smoking status: Never   Smokeless tobacco: Never  Vaping Use   Vaping Use: Never used  Substance Use Topics   Alcohol use: Yes    Comment: occ   Drug use: No    ROS   Objective:   Vitals: BP (!) 170/80 (BP Location: Right Arm)    Pulse 85    Temp 98.4 F (36.9 C) (Oral)    Resp 18    SpO2 98%   Physical Exam Constitutional:      General: She is not in acute distress.    Appearance: Normal appearance. She is well-developed. She is not ill-appearing, toxic-appearing or diaphoretic.  HENT:     Head: Normocephalic and atraumatic.     Nose:  Nose normal.     Mouth/Throat:     Mouth: Mucous membranes are moist.     Pharynx: Oropharynx is clear.  Eyes:     General: No scleral icterus.       Right eye: No discharge.        Left eye: No discharge.     Extraocular Movements: Extraocular movements intact.     Conjunctiva/sclera: Conjunctivae normal.  Cardiovascular:     Rate and Rhythm: Normal rate.     Heart sounds: No murmur heard.   No friction rub. No gallop.  Pulmonary:     Effort: Pulmonary  effort is normal. No respiratory distress.     Breath sounds: No stridor. No wheezing, rhonchi or rales.  Chest:     Chest wall: No tenderness.  Abdominal:     General: Bowel sounds are normal. There is no distension.     Palpations: Abdomen is soft. There is no mass.     Tenderness: There is no abdominal tenderness. There is left CVA tenderness. There is no right CVA tenderness, guarding or rebound.  Musculoskeletal:     Thoracic back: Tenderness (area outlined is area of tenderness mildly elicited on exam but patient does endorse that the pain is more internal) present. No swelling, edema, deformity, signs of trauma, lacerations, spasms or bony tenderness. Normal range of motion. No scoliosis.       Back:  Skin:    General: Skin is warm and dry.  Neurological:     General: No focal deficit present.     Mental Status: She is alert and oriented to person, place, and time.  Psychiatric:        Mood and Affect: Mood normal.        Behavior: Behavior normal.        Thought Content: Thought content normal.        Judgment: Judgment normal.    Assessment and Plan :   PDMP not reviewed this encounter.  1. Acute left-sided thoracic back pain   2. Left flank pain   3. Diverticulosis    Patient was unable to provide a urine sample in clinic despite multiple attempts to hydrate.  Chart review shows that she has a history of diverticulosis scattered throughout the colon.  This together with the nature of her severe left-sided flank  pain is concerning to me for possible obstructive uropathy, pyelonephritis, AKI, diverticulitis.  As such, she will need a higher level of testing and intervention than we can provide in the urgent care setting.  Patient contracts for safety, will head to the emergency room now.   Jaynee Eagles, [place] 05/18/21 [phone]

## 2021-05-18 NOTE — Discharge Instructions (Signed)
Try alternating ice and heat to your flank area.  Avoid twisting movements bending or heavy lifting.  Follow-up with your primary care provider for recheck.  Return to emergency department for any new or worsening symptoms.

## 2021-05-18 NOTE — Discharge Instructions (Signed)
As you are unable to urinate in our clinic and with your history of diverticulosis, severe left-sided flank pain you will need a higher level of testing and care than we can provide in the urgent care setting.  My concern is that you may have obstructive uropathy, a kidney stone preventing you from being able to urinate, diverticulitis, a kidney infection.  All of these are emergent conditions that need to be evaluated with more testing through the hospital.  Please have there now.

## 2021-05-18 NOTE — ED Triage Notes (Signed)
Pt presents to ED with complaints of left flank pain and difficulty urinating x 3-4 days but yesterday pain became constant.

## 2021-05-18 NOTE — ED Provider Notes (Signed)
[facility] EMERGENCY DEPARTMENT Provider Note   CSN: [id number] Arrival date & time: 05/18/21  4098     History  Chief Complaint  Patient presents with   Flank Pain    Jodi Jimenez is a 61 y.o. female.   Flank Pain Pertinent negatives include no chest pain, no abdominal pain and no shortness of breath.       Jodi Jimenez is a 61 y.o. female with past medical history of hypertension, GERD, who presents to the Emergency Department from local urgent care for evaluation of left flank pain.  Symptoms began 3 to 4 days ago.  Initially described as waxing and waning, but now more constant.  She describes pain of the flank area that is reproducible with movement.  Pain is not affected by food intake, no chest pain or shortness of breath.  No fever or chills.  Patient does endorse recent upper respiratory illness, not COVID, and had significant coughing for several days.  Feels that her cough has improved.  No hemoptysis, no history of kidney stones and she denies dysuria.   Home Medications Prior to Admission medications   Medication Sig Start Date End Date Taking? Authorizing Provider  acetaminophen (TYLENOL) 500 MG tablet Take 1,000 mg by mouth every 6 (six) hours as needed for moderate pain.     [provider]  benzonatate (TESSALON) 100 MG capsule Take 1-2 capsules (100-200 mg total) by mouth 3 (three) times daily as needed for cough. 05/02/21   Jaynee Eagles, PA-C  Black Cohosh 80 MG CAPS Take 320 mg by mouth daily.    [provider]  cetirizine (ZYRTEC ALLERGY) 10 MG tablet Take 1 tablet (10 mg total) by mouth daily. 05/02/21   Jaynee Eagles, PA-C  estradiol (ESTRACE) 0.5 MG tablet Take 1 tablet (0.5 mg total) by mouth daily. Patient not taking: Reported on 12/26/2018 07/30/17   Princess Bruins, MD  Evening Primrose Oil 1000 MG CAPS Take 1,000 mg by mouth daily.    [provider]  lisinopril-hydrochlorothiazide (PRINZIDE,ZESTORETIC) 20-25 MG tablet Take  1 tablet by mouth daily.    [provider]  meloxicam (MOBIC) 15 MG tablet Take 1 tablet (15 mg total) by mouth daily. 03/23/20   Scot Jun, FNP  ondansetron (ZOFRAN-ODT) 8 MG disintegrating tablet Place 8 mg inside cheek every 8 (eight) hours as needed. 06/07/19   [provider]  predniSONE (DELTASONE) 10 MG tablet Take 2 tablets (20 mg total) by mouth daily. 03/29/20   Avegno, Darrelyn Hillock, FNP  promethazine (PHENERGAN) 25 MG tablet Take 25 mg by mouth every 6 (six) hours as needed. 06/08/19   [provider]  promethazine-dextromethorphan (PROMETHAZINE-DM) 6.25-15 MG/5ML syrup Take 5 mLs by mouth at bedtime as needed for cough. 05/02/21   Jaynee Eagles, PA-C  pseudoephedrine (SUDAFED) 30 MG tablet Take 1 tablet (30 mg total) by mouth every 8 (eight) hours as needed for congestion. 05/02/21   Jaynee Eagles, PA-C  tiZANidine (ZANAFLEX) 4 MG tablet Take 1 tablet (4 mg total) by mouth every 6 (six) hours as needed for muscle spasms. 03/23/20   Scot Jun, FNP      Allergies    Codeine    Review of Systems   Review of Systems  Constitutional:  Negative for chills and fever.  HENT:  Negative for congestion.   Respiratory:  Negative for shortness of breath.   Cardiovascular:  Negative for chest pain.  Gastrointestinal:  Negative for abdominal pain, diarrhea, nausea and  vomiting.  Genitourinary:  Positive for flank pain. Negative for decreased urine volume, difficulty urinating and dysuria.  Skin:  Negative for rash.   Physical Exam Updated Vital Signs There were no vitals taken for this visit. Physical Exam Vitals and nursing note reviewed.  Constitutional:      Appearance: Normal appearance. She is not ill-appearing.  Cardiovascular:     Rate and Rhythm: Normal rate and regular rhythm.     Pulses: Normal pulses.  Pulmonary:     Effort: Pulmonary effort is normal.     Breath sounds: Normal breath sounds.  Abdominal:     General: There is no  distension.     Palpations: Abdomen is soft.     Tenderness: There is no abdominal tenderness.  Musculoskeletal:        General: Tenderness present. Normal range of motion.     Right lower leg: No edema.     Left lower leg: No edema.     Comments: Diffuse tenderness to palpation of the left thoracic paraspinal muscles.  Some mild tenderness at the base of the scapula as well.  No edema or skin changes noted.  No midline tenderness.  Skin:    General: Skin is warm.     Capillary Refill: Capillary refill takes less than 2 seconds.     Findings: No rash.  Neurological:     General: No focal deficit present.     Mental Status: She is alert.     Sensory: No sensory deficit.     Motor: No weakness.    ED Results / Procedures / Treatments   Labs (all labs ordered are listed, but only abnormal results are displayed) Labs Reviewed  URINALYSIS, ROUTINE W REFLEX MICROSCOPIC - Abnormal; Notable for the following components:      Result Value   Hgb urine dipstick SMALL (*)    Leukocytes,Ua MODERATE (*)    Bacteria, UA RARE (*)    All other components within normal limits  COMPREHENSIVE METABOLIC PANEL - Abnormal; Notable for the following components:   Calcium 8.4 (*)    All other components within normal limits  CBC WITH DIFFERENTIAL/PLATELET    EKG None  Radiology CT Renal Stone Study  Result Date: 05/18/2021 CLINICAL DATA:  Left flank pain and difficulty urinating. EXAM: CT ABDOMEN AND PELVIS WITHOUT CONTRAST TECHNIQUE: Multidetector CT imaging of the abdomen and pelvis was performed following the standard protocol without IV contrast. RADIATION DOSE REDUCTION: This exam was performed according to the departmental dose-optimization program which includes automated exposure control, adjustment of the mA and/or kV according to patient size and/or use of iterative reconstruction technique. COMPARISON:  06/09/2019 FINDINGS: Lower chest: Unremarkable. Hepatobiliary: The liver shows  diffusely decreased attenuation suggesting fat deposition. No focal abnormality in the liver on this study without intravenous contrast. Gallbladder is surgically absent. No intrahepatic or extrahepatic biliary dilation. Pancreas: No focal mass lesion. No dilatation of the main duct. No intraparenchymal cyst. No peripancreatic edema. Spleen: No splenomegaly. No focal mass lesion. Adrenals/Urinary Tract: Left adrenal gland unremarkable. 11 mm low-density right adrenal nodule is actually smaller than on the prior study when it measured 17 mm (remeasured) and has average attenuation today of 4 Hounsfield units, consistent with adenoma. Right kidney and ureter are unremarkable. No stones are seen in the left kidney or ureter. No secondary changes in either kidney or ureter. No bladder stones. Stomach/Bowel: Stomach is decompressed. Duodenum is normally positioned as is the ligament of Treitz. No small bowel wall thickening.  No small bowel dilatation. The terminal ileum is normal. The appendix is normal. No gross colonic mass. No colonic wall thickening. Diverticular changes are noted in the left colon without evidence of diverticulitis. Vascular/Lymphatic: There is mild atherosclerotic calcification of the abdominal aorta without aneurysm. There is no gastrohepatic or hepatoduodenal ligament lymphadenopathy. No retroperitoneal or mesenteric lymphadenopathy. No pelvic sidewall lymphadenopathy. Reproductive: Uterus surgically absent.  There is no adnexal mass. Other: No intraperitoneal free fluid. Musculoskeletal: No worrisome lytic or sclerotic osseous abnormality. Status post ventral hernia repair with mesh placement. IMPRESSION: 1. No acute findings in the abdomen or pelvis. Specifically, no findings to explain the patient's history of left flank pain. 2. 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 3. Hepatic steatosis. 4. Aortic Atherosclerosis (ICD10-I70.0). Electronically Signed   By: Misty Stanley M.D.   On: 05/18/2021 11:21     Procedures Procedures    Medications Ordered in ED Medications - No data to display  ED Course/ Medical Decision Making/ A&P                           Medical Decision Making  Patient here from local urgent care for evaluation of left-sided flank pain.  Symptoms present for several days.  No history of kidney stones and she denies dysuria.  Pain is localized and no known injury.  Pain reproducible with palpation and movement.  On exam, patient is well-appearing nontoxic.  Vital signs reassuring.  No abdominal tenderness on exam.  Differential diagnosis would include ureteral stone, pyelonephritis, acute intra-abdominal process, musculoskeletal injury  Labs ordered along with CT abdomen and pelvis.  Results interpreted by me, labs without evidence of leukocytosis.  Chemistries unremarkable.  Urinalysis without definite infection.  Patient denies any dysuria.  There are moderate leukocytes and 11-20 WBCs, but 6-10 squamous cells also present.  Doubt urine findings are relevant given lack of dysuria symptoms.  Etiology of patient's symptoms unclear.  I feel this is likely musculoskeletal as it is reproducible with palpation and movement.   Doubt acute abdominal process.  Patient appears appropriate for discharge home, agreeable to symptomatic tx,  all questions answered.  Return precautions were discussed.        Final Clinical Impression(s) / ED Diagnoses Final diagnoses:  Flank pain    Rx / DC Orders ED Discharge Orders     None         Kem Parkinson, PA-C 05/18/21 1554    Godfrey Pick, MD 05/19/21 [phone]

## 2021-05-18 NOTE — ED Triage Notes (Signed)
Left flank pain off an on for 3 days.  Yesterday pain was constant.  Denies urinary symptoms.

## 2021-05-18 NOTE — ED Notes (Signed)
Patient unable to give urine specimen,  water given

## 2021-08-15 DIAGNOSIS — H26493 Other secondary cataract, bilateral: Secondary | ICD-10-CM | POA: Diagnosis not present

## 2021-08-19 DIAGNOSIS — H26491 Other secondary cataract, right eye: Secondary | ICD-10-CM | POA: Diagnosis not present

## 2021-09-19 DIAGNOSIS — L259 Unspecified contact dermatitis, unspecified cause: Secondary | ICD-10-CM | POA: Diagnosis not present

## 2021-09-23 ENCOUNTER — Encounter: Payer: Self-pay | Admitting: Emergency Medicine

## 2021-09-23 ENCOUNTER — Ambulatory Visit
Admission: EM | Admit: 2021-09-23 | Discharge: 2021-09-23 | Disposition: A | Discharge status: Home / Self Care | Payer: BC Managed Care – PPO | Attending: Family Medicine | Admitting: Family Medicine

## 2021-09-23 ENCOUNTER — Other Ambulatory Visit: Payer: Self-pay

## 2021-09-23 DIAGNOSIS — L237 Allergic contact dermatitis due to plants, except food: Secondary | ICD-10-CM

## 2021-09-23 MED ORDER — CLOBETASOL PROPIONATE 0.05 % EX OINT: 1.0000 "application " | TOPICAL_OINTMENT | Freq: Two times a day (BID) | CUTANEOUS | 0 refills | Status: DC

## 2021-09-23 NOTE — ED Triage Notes (Signed)
Pt reports seen for poison ivy and given px ointment, prednisone, and steroid injection. Pt reports discoloration of rash on LLE is now dark purple and "extending around my ankle". Denies any known insect bites, injury.

## 2021-09-23 NOTE — ED Provider Notes (Signed)
[facility]    CSN: [id number] Arrival date & time: 09/23/21  8937      History   Chief Complaint Chief Complaint  Patient presents with   Rash    HPI LAKEIDRA RELIFORD is a 61 y.o. female.   Presenting today following up on diffuse poison ivy rash for which she was seen by PCP on Friday.  She was given a steroid shot, prednisone and clobetasol ointment which she has been using faithfully.  She states some areas are starting to improve but still having spread in other areas and now has a dark purple itchy area of discoloration wrapping around her left ankle.  Denies numbness, tingling, diffuse edema, leg weakness, fever, chills, drainage from any of the areas.   Past Medical History:  Diagnosis Date   Abnormal tilt table test    syncope   Breast mass, right    GERD (gastroesophageal reflux disease)    resolved    Helicobacter pylori gastritis 12/19/2012   AUG 2014 EGD-ABO BID FOR 10 DAYS    HTN (hypertension)    PONV (postoperative nausea and vomiting)    Red birthmarks    left hand and forearm   Syncope and collapse    Dr. Jens Som, tilt table test+    Patient Active Problem List   Diagnosis Date Noted   Incarcerated ventral hernia 01/05/2019   Postoperative state 01/23/2015   GERD (gastroesophageal reflux disease) 34/28/7681   Helicobacter pylori gastritis 12/19/2012   Rectal bleeding 12/08/2012   Diarrhea 12/08/2012   Change in bowel function 12/08/2012   Syncope 01/23/2011   HTN (hypertension) 01/23/2011   ELBOW PAIN 01/21/2009   LATERAL EPICONDYLITIS 01/21/2009   Past Surgical History:  Procedure Laterality Date   ABDOMINAL HYSTERECTOMY     vaginal with ovarian preservation    BREAST LUMPECTOMY WITH RADIOACTIVE SEED LOCALIZATION Right 12/24/2017   Procedure: BREAST LUMPECTOMY WITH RADIOACTIVE SEED LOCALIZATION;  Surgeon: Alphonsa Overall, MD;  Location: [facility];  Service: General;  Laterality: Right;   CHOLECYSTECTOMY      COLONOSCOPY N/A 12/13/2012   Procedure: COLONOSCOPY;  Surgeon: Danie Binder, MD;  Location: [facility];  Service: Endoscopy;  Laterality: N/A;  10:45   CYSTOCELE REPAIR N/A 01/23/2015   Procedure: ANTERIOR REPAIR (CYSTOCELE);  Surgeon: Princess Bruins, MD;  Location: [facility];  Service: Gynecology;  Laterality: N/A;   ELBOW ARTHROSCOPY Right    repair torn ligament   ESOPHAGOGASTRODUODENOSCOPY N/A 12/13/2012   Procedure: ESOPHAGOGASTRODUODENOSCOPY (EGD);  Surgeon: Danie Binder, MD;  Location: [facility];  Service: Endoscopy;  Laterality: N/A;   LAPAROSCOPIC ASSISTED VENTRAL HERNIA REPAIR  01/05/2019   RECTOCELE REPAIR  2006   ROBOTIC ASSISTED LAPAROSCOPIC SACROCOLPOPEXY N/A 03/12/2016   Procedure: ROBOTIC ASSISTED LAPAROSCOPIC SACROCOLPOPEXY with Carrolyn Meiers;  Surgeon: Princess Bruins, MD;  Location: [facility];  Service: Gynecology;  Laterality: N/A;  Requests 2 1/2 hrs. REQUEST TINA RNFA   take down of bladder sling  2006   tilt table test positive     heart rate "flatlined"   TUBAL LIGATION     VAGINAL HYSTERECTOMY  2006   with bladder sling   VENTRAL HERNIA REPAIR N/A 01/05/2019   Procedure: LAPAROSCOPIC VENTRAL INCISIONAL HERNIA WITH MESH;  Surgeon: Alphonsa Overall, MD;  Location: [facility];  Service: General;  Laterality: N/A;   OB History     Gravida  3   Para  3   Term      Preterm  AB      Living  3      SAB      IAB      Ectopic      Multiple      Live Births              Home Medications    Prior to Admission medications   Medication Sig Start Date End Date Taking? Authorizing Provider  clobetasol ointment (TEMOVATE) 6.54 % Apply 1 application. topically 2 (two) times daily. 09/23/21  Yes Volney American, PA-C  acetaminophen (TYLENOL) 500 MG tablet Take 1,000 mg by mouth every 6 (six) hours as needed for moderate pain.     [provider]  benzonatate (TESSALON) 100 MG capsule Take 1-2 capsules (100-200 mg total) by mouth 3 (three)  times daily as needed for cough. 05/02/21   Jaynee Eagles, PA-C  Black Cohosh 80 MG CAPS Take 320 mg by mouth daily.    [provider]  cetirizine (ZYRTEC ALLERGY) 10 MG tablet Take 1 tablet (10 mg total) by mouth daily. 05/02/21   Jaynee Eagles, PA-C  estradiol (ESTRACE) 0.5 MG tablet Take 1 tablet (0.5 mg total) by mouth daily. Patient not taking: Reported on 12/26/2018 07/30/17   Princess Bruins, MD  Evening Primrose Oil 1000 MG CAPS Take 1,000 mg by mouth daily.    [provider]  lidocaine (LIDODERM) 5 % Place 1 patch onto the skin daily. Remove & Discard patch within 12 hours or as directed by MD 05/18/21   Triplett, Tammy, PA-C  lisinopril-hydrochlorothiazide (PRINZIDE,ZESTORETIC) 20-25 MG tablet Take 1 tablet by mouth daily.    [provider]  methocarbamol (ROBAXIN) 500 MG tablet Take 1 tablet (500 mg total) by mouth 3 (three) times daily. 05/18/21   Triplett, Tammy, PA-C  naproxen (NAPROSYN) 500 MG tablet Take 1 tablet (500 mg total) by mouth 2 (two) times daily. 05/18/21   Triplett, Tammy, PA-C  ondansetron (ZOFRAN-ODT) 8 MG disintegrating tablet Place 8 mg inside cheek every 8 (eight) hours as needed. 06/07/19   [provider]  predniSONE (DELTASONE) 10 MG tablet Take 2 tablets (20 mg total) by mouth daily. 03/29/20   Avegno, Darrelyn Hillock, FNP  promethazine (PHENERGAN) 25 MG tablet Take 25 mg by mouth every 6 (six) hours as needed. 06/08/19   [provider]  promethazine-dextromethorphan (PROMETHAZINE-DM) 6.25-15 MG/5ML syrup Take 5 mLs by mouth at bedtime as needed for cough. 05/02/21   Jaynee Eagles, PA-C  pseudoephedrine (SUDAFED) 30 MG tablet Take 1 tablet (30 mg total) by mouth every 8 (eight) hours as needed for congestion. 05/02/21   Jaynee Eagles, PA-C    Family History Family History  Problem Relation Age of Onset   Heart failure Other        x3 maternal side   Heart attack Other    Diabetes Other    Heart disease Other        paternal side    Diabetes Mother    Hypertension Mother    Hypertension Father    Diabetes Maternal Grandmother    Hypertension Maternal Grandmother    Diabetes Maternal Grandfather    Hypertension Maternal Grandfather    Hypertension Paternal Grandmother    Hypertension Paternal Grandfather    Colon cancer Neg Hx     Social History Social History   Tobacco Use   Smoking status: Never   Smokeless tobacco: Never  Vaping Use   Vaping Use: Never used  Substance Use Topics  Alcohol use: Yes    Comment: occ   Drug use: No     Allergies   Codeine   Review of Systems Review of Systems Per HPI  Physical Exam Triage Vital Signs ED Triage Vitals  Enc Vitals Group     BP 09/23/21 0910 (!) 174/94     Pulse Rate 09/23/21 0910 67     Resp 09/23/21 0910 18     Temp 09/23/21 0910 97.8 F (36.6 C)     Temp Source 09/23/21 0910 Oral     SpO2 09/23/21 0910 95 %     Weight 09/23/21 0911 210 lb (95.3 kg)     Height 09/23/21 0911 '5\' 2"'$  (1.575 m)     Head Circumference --      Peak Flow --      Pain Score 09/23/21 0911 0     Pain Loc --      Pain Edu? --      Excl. in [place]? --    No data found.  Updated Vital Signs BP (!) 174/94 (BP Location: Right Arm)   Pulse 67   Temp 97.8 F (36.6 C) (Oral)   Resp 18   Ht '5\' 2"'$  (1.575 m)   Wt 210 lb (95.3 kg)   SpO2 95%   BMI 38.41 kg/m   Visual Acuity Right Eye Distance:   Left Eye Distance:   Bilateral Distance:    Right Eye Near:   Left Eye Near:    Bilateral Near:     Physical Exam Vitals and nursing note reviewed.  Constitutional:      Appearance: Normal appearance. She is not ill-appearing.  HENT:     Head: Atraumatic.  Eyes:     Extraocular Movements: Extraocular movements intact.     Conjunctiva/sclera: Conjunctivae normal.  Cardiovascular:     Rate and Rhythm: Normal rate and regular rhythm.     Heart sounds: Normal heart sounds.  Pulmonary:     Effort: Pulmonary effort is normal.     Breath sounds: Normal breath  sounds.  Musculoskeletal:        General: Normal range of motion.     Cervical back: Normal range of motion and neck supple.  Skin:    General: Skin is warm.     Findings: Rash present.     Comments: Areas of erythematous maculopapular and blistering rash bilateral upper extremities, several patches on lower extremities.  Hyperpigmented horizontal region extending around anterior portion of left lower leg with several new spots appearing above and below  Neurological:     Mental Status: She is alert and oriented to person, place, and time.  Psychiatric:        Mood and Affect: Mood normal.        Thought Content: Thought content normal.        Judgment: Judgment normal.     UC Treatments / Results  Labs (all labs ordered are listed, but only abnormal results are displayed) Labs Reviewed - No data to display  EKG   Radiology No results found.  Procedures Procedures (including critical care time)  Medications Ordered in UC Medications - No data to display  Initial Impression / Assessment and Plan / UC Course  I have reviewed the triage vital signs and the nursing notes.  Pertinent labs & imaging results that were available during my care of the patient were reviewed by me and considered in my medical decision making (see chart for details).  This appears to be improving per patient on multiple forms of steroid treatment, continue both topical and oral steroids.  Return precautions reviewed.  Final Clinical Impressions(s) / UC Diagnoses   Final diagnoses:  Poison ivy dermatitis   Discharge Instructions   None    ED Prescriptions     Medication Sig Dispense Auth. Provider   clobetasol ointment (TEMOVATE) 8.87 % Apply 1 application. topically 2 (two) times daily. 80 g Volney American, [place]      PDMP not reviewed this encounter.   Merrie Roof [place], [place] 09/23/21 [phone]

## 2021-11-17 DIAGNOSIS — R42 Dizziness and giddiness: Secondary | ICD-10-CM | POA: Diagnosis not present

## 2021-11-17 DIAGNOSIS — R1011 Right upper quadrant pain: Secondary | ICD-10-CM | POA: Diagnosis not present

## 2021-11-17 DIAGNOSIS — R110 Nausea: Secondary | ICD-10-CM | POA: Diagnosis not present

## 2021-12-19 DIAGNOSIS — I10 Essential (primary) hypertension: Secondary | ICD-10-CM | POA: Diagnosis not present

## 2021-12-24 DIAGNOSIS — I10 Essential (primary) hypertension: Secondary | ICD-10-CM | POA: Diagnosis not present

## 2021-12-24 DIAGNOSIS — R0981 Nasal congestion: Secondary | ICD-10-CM | POA: Diagnosis not present

## 2021-12-24 DIAGNOSIS — R059 Cough, unspecified: Secondary | ICD-10-CM | POA: Diagnosis not present

## 2021-12-24 DIAGNOSIS — Z0001 Encounter for general adult medical examination with abnormal findings: Secondary | ICD-10-CM | POA: Diagnosis not present

## 2021-12-24 DIAGNOSIS — R519 Headache, unspecified: Secondary | ICD-10-CM | POA: Diagnosis not present

## 2021-12-24 DIAGNOSIS — J029 Acute pharyngitis, unspecified: Secondary | ICD-10-CM | POA: Diagnosis not present

## 2022-02-10 DIAGNOSIS — Z1231 Encounter for screening mammogram for malignant neoplasm of breast: Secondary | ICD-10-CM | POA: Diagnosis not present

## 2022-03-30 DIAGNOSIS — E785 Hyperlipidemia, unspecified: Secondary | ICD-10-CM | POA: Diagnosis not present

## 2022-03-30 DIAGNOSIS — R7301 Impaired fasting glucose: Secondary | ICD-10-CM | POA: Diagnosis not present

## 2022-04-03 DIAGNOSIS — I10 Essential (primary) hypertension: Secondary | ICD-10-CM | POA: Diagnosis not present

## 2022-04-03 DIAGNOSIS — E876 Hypokalemia: Secondary | ICD-10-CM | POA: Diagnosis not present

## 2022-04-03 DIAGNOSIS — R7301 Impaired fasting glucose: Secondary | ICD-10-CM | POA: Diagnosis not present

## 2022-04-03 DIAGNOSIS — E785 Hyperlipidemia, unspecified: Secondary | ICD-10-CM | POA: Diagnosis not present

## 2022-04-03 DIAGNOSIS — K580 Irritable bowel syndrome with diarrhea: Secondary | ICD-10-CM | POA: Diagnosis not present

## 2022-05-04 DIAGNOSIS — I10 Essential (primary) hypertension: Secondary | ICD-10-CM | POA: Diagnosis not present

## 2022-05-04 DIAGNOSIS — F419 Anxiety disorder, unspecified: Secondary | ICD-10-CM | POA: Diagnosis not present

## 2022-05-04 DIAGNOSIS — Z79899 Other long term (current) drug therapy: Secondary | ICD-10-CM | POA: Diagnosis not present

## 2022-06-17 DIAGNOSIS — J09X9 Influenza due to identified novel influenza A virus with other manifestations: Secondary | ICD-10-CM | POA: Diagnosis not present

## 2022-06-17 DIAGNOSIS — Z6835 Body mass index (BMI) 35.0-35.9, adult: Secondary | ICD-10-CM | POA: Diagnosis not present

## 2022-06-17 DIAGNOSIS — E669 Obesity, unspecified: Secondary | ICD-10-CM | POA: Diagnosis not present

## 2022-09-29 DIAGNOSIS — R7303 Prediabetes: Secondary | ICD-10-CM | POA: Diagnosis not present

## 2022-09-29 DIAGNOSIS — E785 Hyperlipidemia, unspecified: Secondary | ICD-10-CM | POA: Diagnosis not present

## 2022-10-05 DIAGNOSIS — I10 Essential (primary) hypertension: Secondary | ICD-10-CM | POA: Diagnosis not present

## 2022-10-05 DIAGNOSIS — F419 Anxiety disorder, unspecified: Secondary | ICD-10-CM | POA: Diagnosis not present

## 2022-10-05 DIAGNOSIS — K580 Irritable bowel syndrome with diarrhea: Secondary | ICD-10-CM | POA: Diagnosis not present

## 2022-10-05 DIAGNOSIS — E785 Hyperlipidemia, unspecified: Secondary | ICD-10-CM | POA: Diagnosis not present

## 2022-10-31 IMAGING — CT CT RENAL STONE PROTOCOL
2 of 4 series · 16 of 46 positions shown, 18 images · non-contrast
Comparison: 06/09/2019

CLINICAL DATA: Left flank pain and difficulty urinating.



[Series 2: axial st · axial · 0.98mm/px · z∈[+737,+1197]mm · 13 of 106 slices shown, 15 images]
[im 7/106  soft-tissue]
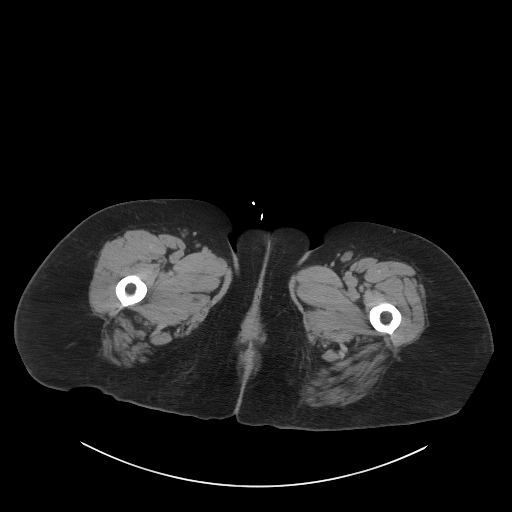
[im 7/106  bone]
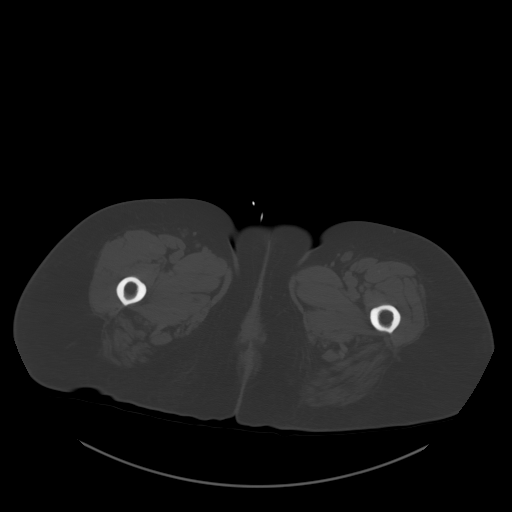
[im 13/106  soft-tissue]
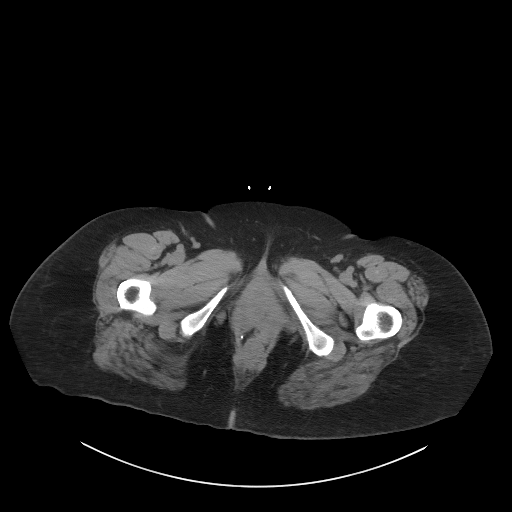
[im 25/106  soft-tissue]
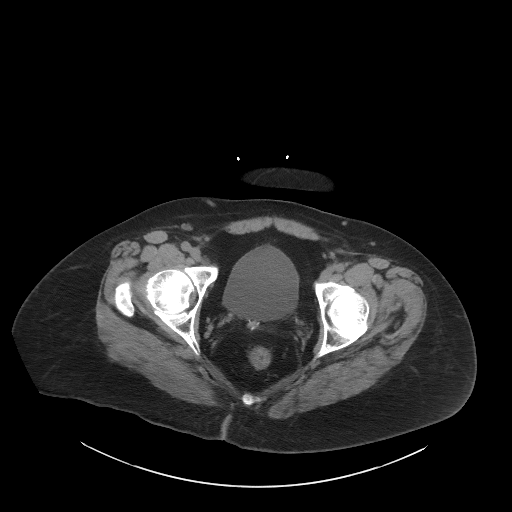
[im 31/106  soft-tissue]
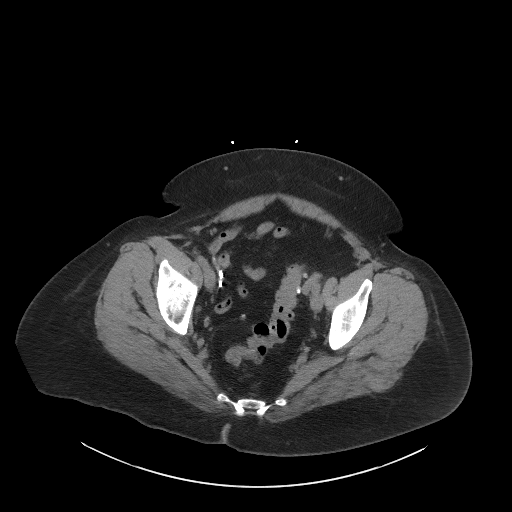
[im 38/106  soft-tissue]
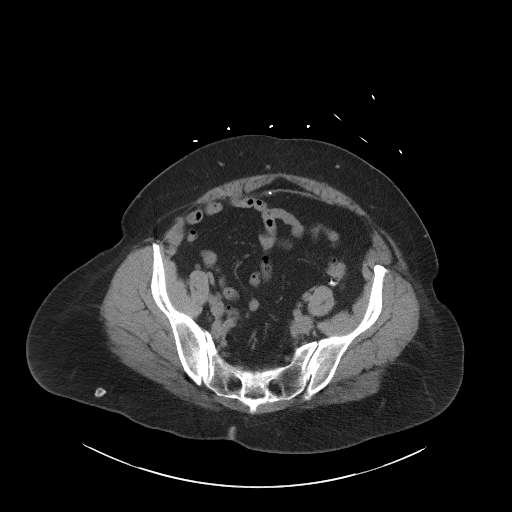
[im 44/106  soft-tissue]
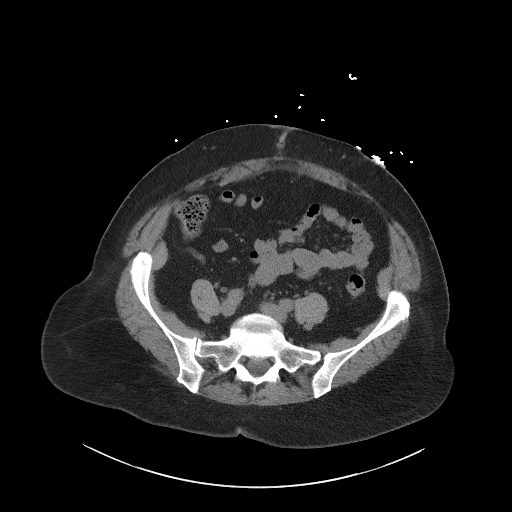
[im 56/106  soft-tissue]
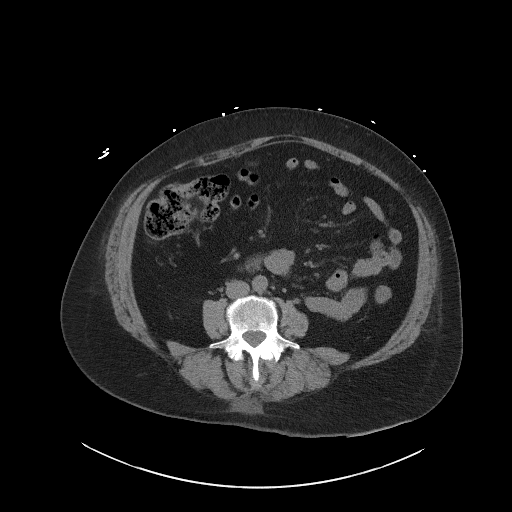
[im 62/106  soft-tissue]
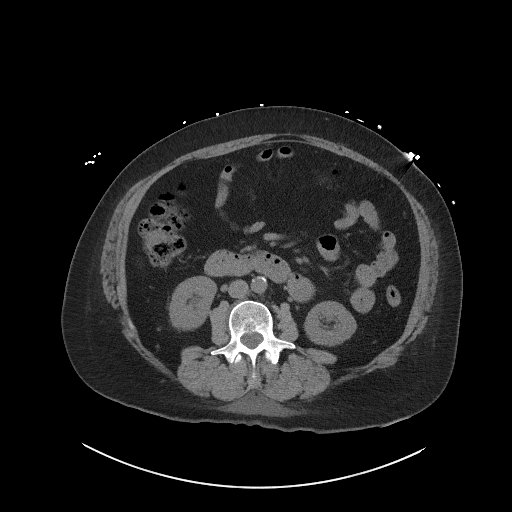
[im 68/106  soft-tissue]
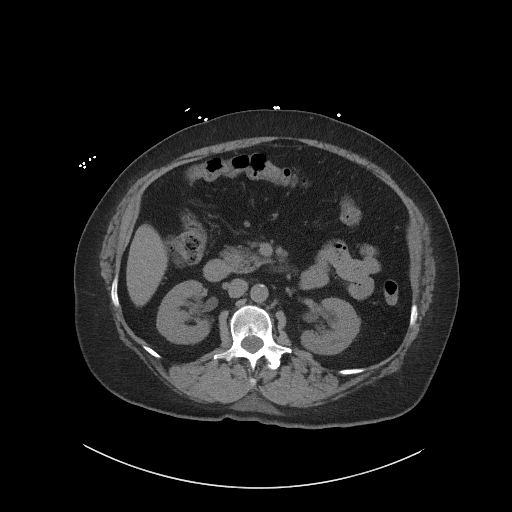
[im 68/106  bone]
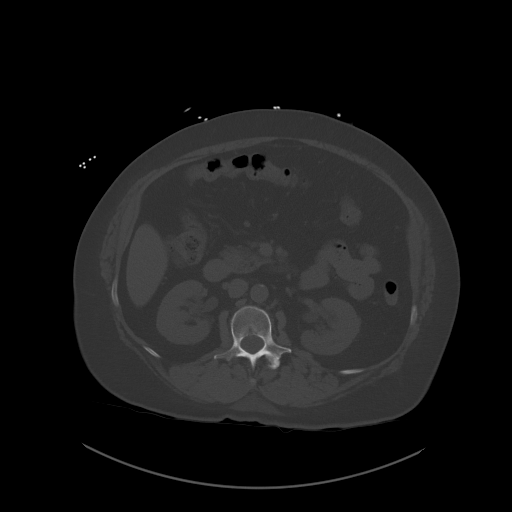
[im 75/106  soft-tissue]
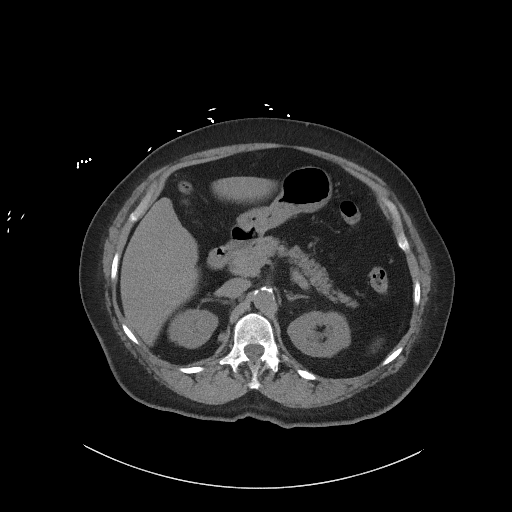
[im 81/106  soft-tissue]
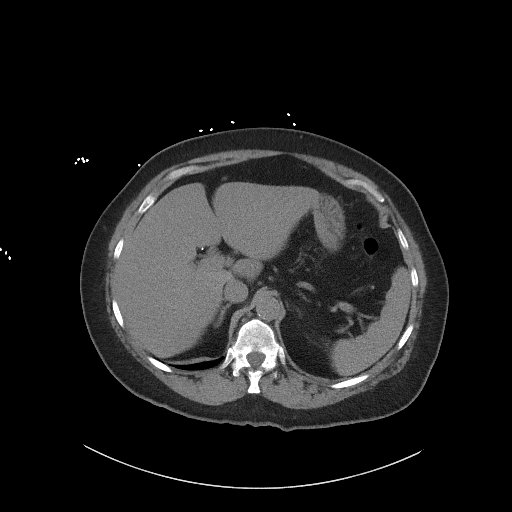
[im 93/106  soft-tissue]
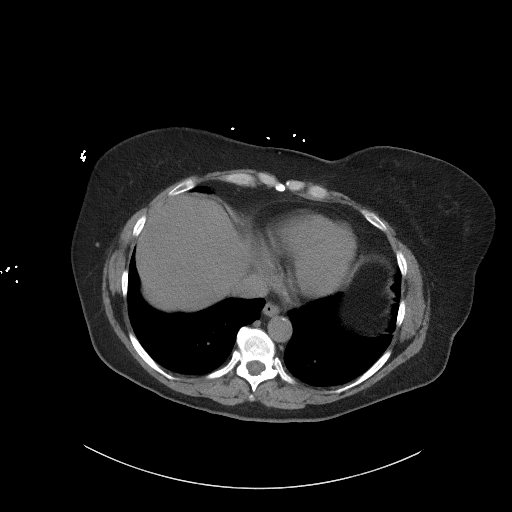
[im 99/106  soft-tissue]
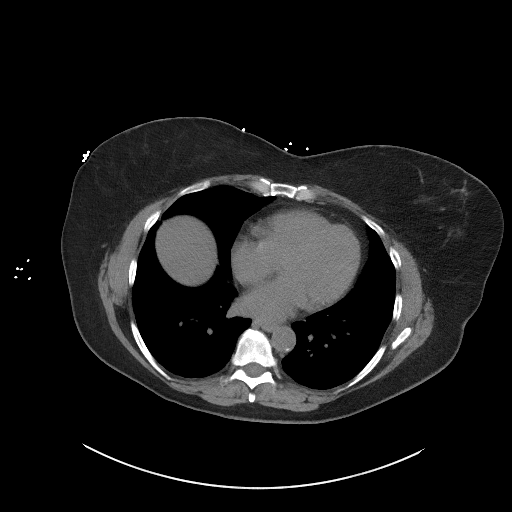

[Series 5: coronal st · coronal · 0.96mm/px · 3 of 122 slices shown]
[im 41/122  soft-tissue]
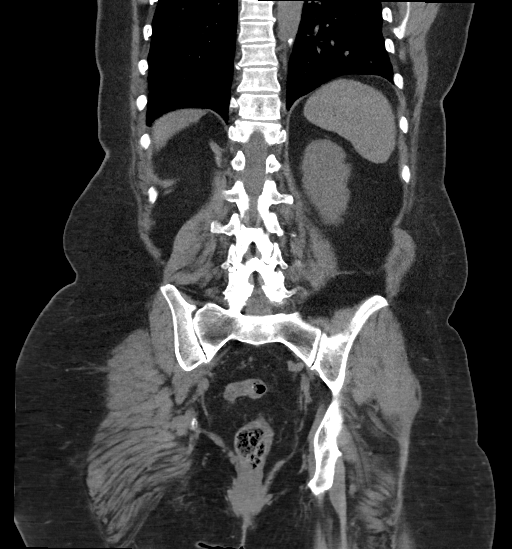
[im 54/122  soft-tissue]
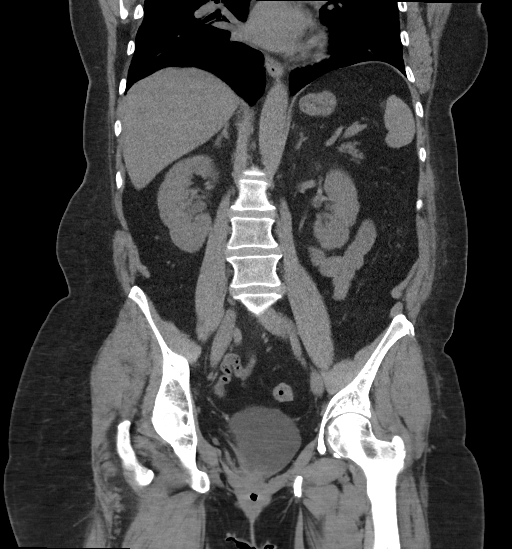
[im 68/122  soft-tissue]
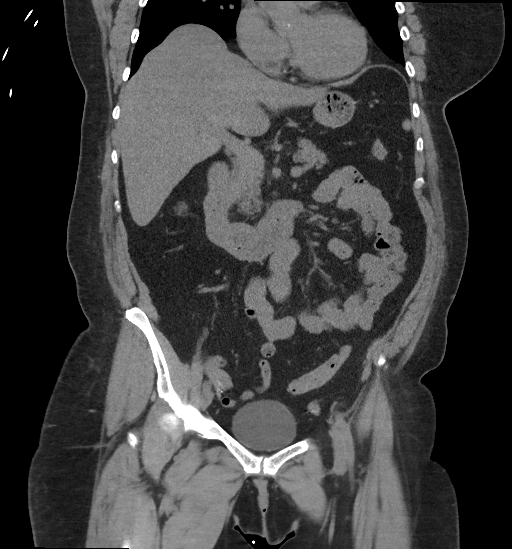

[16 of 46 positions shown; findings below may reference images not displayed]

FINDINGS: Lower chest: Unremarkable.

Hepatobiliary: The liver shows diffusely decreased attenuation
suggesting fat deposition. No focal abnormality in the liver on this
study without intravenous contrast. Gallbladder is surgically
absent. No intrahepatic or extrahepatic biliary dilation.

Pancreas: No focal mass lesion. No dilatation of the main duct. No
intraparenchymal cyst. No peripancreatic edema.

Spleen: No splenomegaly. No focal mass lesion.

Adrenals/Urinary Tract: Left adrenal gland unremarkable. 11 mm
low-density right adrenal nodule is actually smaller than on the
prior study when it measured 17 mm (remeasured) and has average
attenuation today of 4 Hounsfield units, consistent with adenoma.
Right kidney and ureter are unremarkable. No stones are seen in the
left kidney or ureter. No secondary changes in either kidney or
ureter. No bladder stones.

Stomach/Bowel: Stomach is decompressed. Duodenum is normally
positioned as is the ligament of Treitz. No small bowel wall
thickening. No small bowel dilatation. The terminal ileum is normal.
The appendix is normal. No gross colonic mass. No colonic wall
thickening. Diverticular changes are noted in the left colon without
evidence of diverticulitis.

Vascular/Lymphatic: There is mild atherosclerotic calcification of
the abdominal aorta without aneurysm. There is no gastrohepatic or
hepatoduodenal ligament lymphadenopathy. No retroperitoneal or
mesenteric lymphadenopathy. No pelvic sidewall lymphadenopathy.

Reproductive: Uterus surgically absent.  There is no adnexal mass.

Other: No intraperitoneal free fluid.

Musculoskeletal: No worrisome lytic or sclerotic osseous
abnormality. Status post ventral hernia repair with mesh placement.
IMPRESSION: 1. No acute findings in the abdomen or pelvis. Specifically, no
findings to explain the patient's history of left flank pain.
2. 11 mm right adrenal adenoma.
3. Hepatic steatosis.
4. Aortic Atherosclerosis ([id number]).

## 2022-11-09 ENCOUNTER — Encounter: Payer: Self-pay | Admitting: *Deleted

## 2022-11-23 DIAGNOSIS — Z6836 Body mass index (BMI) 36.0-36.9, adult: Secondary | ICD-10-CM | POA: Diagnosis not present

## 2022-11-23 DIAGNOSIS — E669 Obesity, unspecified: Secondary | ICD-10-CM | POA: Diagnosis not present

## 2022-11-23 DIAGNOSIS — J019 Acute sinusitis, unspecified: Secondary | ICD-10-CM | POA: Diagnosis not present

## 2022-11-23 DIAGNOSIS — R0981 Nasal congestion: Secondary | ICD-10-CM | POA: Diagnosis not present

## 2023-02-23 DIAGNOSIS — Z1231 Encounter for screening mammogram for malignant neoplasm of breast: Secondary | ICD-10-CM | POA: Diagnosis not present

## 2023-10-21 ENCOUNTER — Encounter (INDEPENDENT_AMBULATORY_CARE_PROVIDER_SITE_OTHER): Payer: Self-pay | Admitting: *Deleted

## 2023-11-04 ENCOUNTER — Telehealth: Payer: Self-pay

## 2023-11-04 NOTE — Telephone Encounter (Signed)
 Who is your primary care physician: Dr.Hall  Reasons for the colonoscopy:Hx Polyps  Have you had a colonoscopy before?  Yes 12/13/2012 Jodi Fields  Do you have family history of colon cancer? no  Previous colonoscopy with polyps removed? yes  Do you have a history colorectal cancer?   no  Are you diabetic? If yes, Type 1 or Type 2?    no  Do you have a prosthetic or mechanical heart valve? no  Do you have a pacemaker/defibrillator?   no  Have you had endocarditis/atrial fibrillation? no  Have you had joint replacement within the last 12 months?  no  Do you tend to be constipated or have to use laxatives? no  Do you have any history of drugs or alchohol?  no  Do you use supplemental oxygen?  no  Have you had a stroke or heart attack within the last 6 months? no  Do you take weight loss medication?  no  For female patients: have you had a hysterectomy?  yes                                     are you post menopausal?       yes                                            do you still have your menstrual cycle? no      Do you take any blood-thinning medications such as: (aspirin, warfarin, Plavix, Aggrenox)  no  If yes we need the name, milligram, dosage and who is prescribing doctor  Current Outpatient Medications on File Prior to Visit  Medication Sig Dispense Refill   acetaminophen  (TYLENOL ) 500 MG tablet Take 1,000 mg by mouth every 6 (six) hours as needed for moderate pain.      benzonatate  (TESSALON ) 100 MG capsule Take 1-2 capsules (100-200 mg total) by mouth 3 (three) times daily as needed for cough. 60 capsule 0   Black Cohosh 80 MG CAPS Take 320 mg by mouth daily.     cetirizine  (ZYRTEC  ALLERGY) 10 MG tablet Take 1 tablet (10 mg total) by mouth daily. 30 tablet 0   clobetasol  ointment (TEMOVATE ) 0.05 % Apply 1 application. topically 2 (two) times daily. 80 g 0   estradiol  (ESTRACE ) 0.5 MG tablet Take 1 tablet (0.5 mg total) by mouth daily. (Patient not taking:  Reported on 12/26/2018) 90 tablet 4   Evening Primrose Oil 1000 MG CAPS Take 1,000 mg by mouth daily.     lidocaine  (LIDODERM ) 5 % Place 1 patch onto the skin daily. Remove & Discard patch within 12 hours or as directed by MD 30 patch 0   lisinopril -hydrochlorothiazide  (PRINZIDE ,ZESTORETIC ) 20-25 MG tablet Take 1 tablet by mouth daily.     methocarbamol  (ROBAXIN ) 500 MG tablet Take 1 tablet (500 mg total) by mouth 3 (three) times daily. 21 tablet 0   naproxen  (NAPROSYN ) 500 MG tablet Take 1 tablet (500 mg total) by mouth 2 (two) times daily. 30 tablet 0   ondansetron  (ZOFRAN -ODT) 8 MG disintegrating tablet Place 8 mg inside cheek every 8 (eight) hours as needed.     predniSONE  (DELTASONE ) 10 MG tablet Take 2 tablets (20 mg total) by mouth daily. 15 tablet 0   promethazine  (PHENERGAN ) 25 MG tablet  Take 25 mg by mouth every 6 (six) hours as needed.     promethazine -dextromethorphan (PROMETHAZINE -DM) 6.25-15 MG/5ML syrup Take 5 mLs by mouth at bedtime as needed for cough. 100 mL 0   pseudoephedrine  (SUDAFED) 30 MG tablet Take 1 tablet (30 mg total) by mouth every 8 (eight) hours as needed for congestion. 30 tablet 0   No current facility-administered medications on file prior to visit.    Allergies  Allergen Reactions   Codeine Nausea And Vomiting     Pharmacy: [facility] [place]   Primary Insurance Name: CHARON [id number] LEL[place] number where you can be reached: [phone]

## 2023-12-17 NOTE — Telephone Encounter (Signed)
 ASA 2, ok to schedule.   BMP pre-op

## 2023-12-20 NOTE — Telephone Encounter (Signed)
 LMOVM to call back

## 2024-01-04 NOTE — Telephone Encounter (Signed)
 ATC pt, no answer. LVM, letter sent.

## 2024-04-24 ENCOUNTER — Encounter (INDEPENDENT_AMBULATORY_CARE_PROVIDER_SITE_OTHER): Payer: Self-pay | Admitting: *Deleted
